# Patient Record
Sex: Female | Born: 1985 | Race: White | Hispanic: No | Marital: Married | Smoking: Never smoker
Health system: Southern US, Community
[De-identification: ages and names within clinical notes are randomized; demographics above are authoritative.]

## PROBLEM LIST (undated history)

## (undated) DIAGNOSIS — F329 Major depressive disorder, single episode, unspecified: Secondary | ICD-10-CM

## (undated) DIAGNOSIS — IMO0002 Reserved for concepts with insufficient information to code with codable children: Secondary | ICD-10-CM

## (undated) DIAGNOSIS — Z9889 Other specified postprocedural states: Secondary | ICD-10-CM

## (undated) DIAGNOSIS — R112 Nausea with vomiting, unspecified: Secondary | ICD-10-CM

## (undated) DIAGNOSIS — O99340 Other mental disorders complicating pregnancy, unspecified trimester: Secondary | ICD-10-CM

## (undated) DIAGNOSIS — F419 Anxiety disorder, unspecified: Secondary | ICD-10-CM

## (undated) DIAGNOSIS — C801 Malignant (primary) neoplasm, unspecified: Secondary | ICD-10-CM

## (undated) DIAGNOSIS — F32A Depression, unspecified: Secondary | ICD-10-CM

## (undated) DIAGNOSIS — T8859XA Other complications of anesthesia, initial encounter: Secondary | ICD-10-CM

## (undated) DIAGNOSIS — T4145XA Adverse effect of unspecified anesthetic, initial encounter: Secondary | ICD-10-CM

## (undated) HISTORY — DX: Malignant (primary) neoplasm, unspecified: C80.1

## (undated) HISTORY — DX: Anxiety disorder, unspecified: F41.9

## (undated) HISTORY — PX: APPENDECTOMY: SHX54

## (undated) HISTORY — DX: Reserved for concepts with insufficient information to code with codable children: IMO0002

---
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
## 2007-10-11 ENCOUNTER — Encounter: Admission: RE | Admit: 2007-10-11 | Discharge: 2007-10-11 | Payer: Self-pay | Admitting: Internal Medicine

## 2009-06-05 ENCOUNTER — Emergency Department: Payer: Self-pay | Admitting: Emergency Medicine

## 2009-06-06 ENCOUNTER — Emergency Department: Payer: Self-pay | Admitting: Emergency Medicine

## 2010-04-19 ENCOUNTER — Inpatient Hospital Stay: Payer: Self-pay | Admitting: Obstetrics and Gynecology

## 2011-01-16 ENCOUNTER — Ambulatory Visit: Payer: Self-pay | Admitting: Family Medicine

## 2012-08-18 ENCOUNTER — Ambulatory Visit: Payer: Self-pay

## 2013-09-10 ENCOUNTER — Encounter: Payer: Self-pay | Admitting: Maternal and Fetal Medicine

## 2013-09-20 ENCOUNTER — Encounter: Payer: Self-pay | Admitting: Maternal and Fetal Medicine

## 2013-10-26 DIAGNOSIS — F53 Postpartum depression: Secondary | ICD-10-CM

## 2013-10-26 DIAGNOSIS — O99345 Other mental disorders complicating the puerperium: Secondary | ICD-10-CM | POA: Insufficient documentation

## 2014-01-07 ENCOUNTER — Observation Stay: Payer: Self-pay | Admitting: Obstetrics and Gynecology

## 2014-01-07 LAB — LIPASE, BLOOD: Lipase: 96 U/L

## 2014-01-07 LAB — COMPREHENSIVE METABOLIC PANEL WITH GFR
Albumin: 2.6 g/dL — ABNORMAL LOW
Alkaline Phosphatase: 66 U/L
Anion Gap: 10
BUN: 6 mg/dL — ABNORMAL LOW
Bilirubin,Total: 0.6 mg/dL
Calcium, Total: 8.1 mg/dL — ABNORMAL LOW
Chloride: 105 mmol/L
Co2: 23 mmol/L
Creatinine: 0.47 mg/dL — ABNORMAL LOW
EGFR (African American): >60
EGFR (Non-African Amer.): >60
Glucose: 215 mg/dL — ABNORMAL HIGH
Osmolality: 280
Potassium: 3.1 mmol/L — ABNORMAL LOW
SGOT(AST): 13 U/L — ABNORMAL LOW
SGPT (ALT): 9 U/L — ABNORMAL LOW
Sodium: 138 mmol/L
Total Protein: 6 g/dL — ABNORMAL LOW

## 2014-01-07 LAB — URINALYSIS, COMPLETE
Bilirubin,UR: NEGATIVE
Blood: NEGATIVE
Glucose,UR: NEGATIVE mg/dL
Nitrite: NEGATIVE
Ph: 6
Protein: NEGATIVE
RBC,UR: 3 /HPF
Specific Gravity: 1.02
Squamous Epithelial: 3
WBC UR: 7 /HPF

## 2014-01-07 LAB — AMYLASE: Amylase: 45 U/L

## 2014-01-08 LAB — URINE CULTURE

## 2014-03-19 ENCOUNTER — Inpatient Hospital Stay: Payer: Self-pay

## 2014-03-19 LAB — CBC WITH DIFFERENTIAL/PLATELET
BASOS PCT: 0.7 %
Basophil #: 0.1 10*3/uL (ref 0.0–0.1)
Eosinophil #: 0.1 10*3/uL (ref 0.0–0.7)
Eosinophil %: 0.6 %
HCT: 40.7 % (ref 35.0–47.0)
HGB: 13.7 g/dL (ref 12.0–16.0)
Lymphocyte #: 1.8 10*3/uL (ref 1.0–3.6)
Lymphocyte %: 20.4 %
MCH: 33.8 pg (ref 26.0–34.0)
MCHC: 33.7 g/dL (ref 32.0–36.0)
MCV: 100 fL (ref 80–100)
MONO ABS: 0.5 x10 3/mm (ref 0.2–0.9)
Monocyte %: 6.2 %
Neutrophil #: 6.2 10*3/uL (ref 1.4–6.5)
Neutrophil %: 72.1 %
PLATELETS: 168 10*3/uL (ref 150–440)
RBC: 4.07 10*6/uL (ref 3.80–5.20)
RDW: 13.4 % (ref 11.5–14.5)
WBC: 8.6 10*3/uL (ref 3.6–11.0)

## 2014-03-20 LAB — HEMATOCRIT: HCT: 35.4 % (ref 35.0–47.0)

## 2014-12-28 NOTE — H&P (Signed)
L&D Evaluation:  History:  HPI 29 y/o G2P1001 EDC 03/15/14 here for postdates IOL. Well pregnancy, HX PPD (lexapro) GBS negative. Uc's through the night, denies leaking fluid or vaginal bleeding, baby is active.   Presents with contractions, IOL postdates   Patient's Medical History No Chronic Illness   Patient's Surgical History none   Medications Pre Natal Vitamins   Allergies NKDA   Social History none   Family History Non-Contributory   ROS:  ROS All systems were reviewed.  HEENT, CNS, GI, GU, Respiratory, CV, Renal and Musculoskeletal systems were found to be normal.   Exam:  Vital Signs stable   Urine Protein not completed   General no apparent distress   Mental Status clear   Chest clear   Heart normal sinus rhythm   Abdomen gravid, non-tender   Estimated Fetal Weight Average for gestational age   Fetal Position vtx   Fundal Height term   Back no CVAT   Edema 1+   Reflexes 2+   Clonus negative   Pelvic 2-3cm 50% vtx @ -1 at office prior   Mebranes Intact   FHT normal rate with no decels, baseline 130's 140's avg variabiity with accels 140's 150's   Fetal Heart Rate 136   Ucx irregular, Q 2/4 mins 45/60 sec mild/moderate   Skin dry   Lymph no lymphadenopathy   Impression:  Impression IOL postdates.   Plan:  Plan EFM/NST, monitor contractions and for cervical change   Comments L&D unit busy this am. NST reactive, UC's q 2/4 mins 45/60 sec mild/moderate. Will ambulate and move to labor room when available. Pt and husband state understanding.   Electronic Signatures: Rosie Fate (CNM)  (Signed 31-Jul-15 09:48)  Authored: L&D Evaluation   Last Updated: 31-Jul-15 09:48 by Rosie Fate (CNM)

## 2015-01-03 ENCOUNTER — Emergency Department
Admission: EM | Admit: 2015-01-03 | Discharge: 2015-01-03 | Disposition: A | Discharge status: Home / Self Care | Payer: BC Managed Care – PPO | Attending: Emergency Medicine | Admitting: Emergency Medicine

## 2015-01-03 ENCOUNTER — Emergency Department: Payer: BC Managed Care – PPO

## 2015-01-03 ENCOUNTER — Encounter: Payer: Self-pay | Admitting: Emergency Medicine

## 2015-01-03 DIAGNOSIS — Y998 Other external cause status: Secondary | ICD-10-CM | POA: Insufficient documentation

## 2015-01-03 DIAGNOSIS — S79912A Unspecified injury of left hip, initial encounter: Secondary | ICD-10-CM | POA: Diagnosis not present

## 2015-01-03 DIAGNOSIS — Z79899 Other long term (current) drug therapy: Secondary | ICD-10-CM | POA: Diagnosis not present

## 2015-01-03 DIAGNOSIS — M25552 Pain in left hip: Secondary | ICD-10-CM

## 2015-01-03 DIAGNOSIS — S161XXA Strain of muscle, fascia and tendon at neck level, initial encounter: Secondary | ICD-10-CM | POA: Diagnosis not present

## 2015-01-03 DIAGNOSIS — Y9389 Activity, other specified: Secondary | ICD-10-CM | POA: Diagnosis not present

## 2015-01-03 DIAGNOSIS — S299XXA Unspecified injury of thorax, initial encounter: Secondary | ICD-10-CM | POA: Insufficient documentation

## 2015-01-03 DIAGNOSIS — R52 Pain, unspecified: Secondary | ICD-10-CM

## 2015-01-03 DIAGNOSIS — S199XXA Unspecified injury of neck, initial encounter: Secondary | ICD-10-CM | POA: Diagnosis present

## 2015-01-03 DIAGNOSIS — Y92410 Unspecified street and highway as the place of occurrence of the external cause: Secondary | ICD-10-CM | POA: Insufficient documentation

## 2015-01-03 DIAGNOSIS — Z3202 Encounter for pregnancy test, result negative: Secondary | ICD-10-CM | POA: Insufficient documentation

## 2015-01-03 DIAGNOSIS — R0789 Other chest pain: Secondary | ICD-10-CM

## 2015-01-03 HISTORY — DX: Major depressive disorder, single episode, unspecified: F32.9

## 2015-01-03 HISTORY — DX: Depression, unspecified: F32.A

## 2015-01-03 HISTORY — DX: Other mental disorders complicating pregnancy, unspecified trimester: O99.340

## 2015-01-03 LAB — POCT PREGNANCY, URINE: Preg Test, Ur: NEGATIVE

## 2015-01-03 MED ORDER — DIAZEPAM 2 MG PO TABS: 2.0000 mg | ORAL_TABLET | Freq: Three times a day (TID) | ORAL | Status: DC | PRN

## 2015-01-03 MED ORDER — HYDROCODONE-ACETAMINOPHEN 5-325 MG PO TABS: 1.0000 | ORAL_TABLET | ORAL | Status: DC | PRN

## 2015-01-03 MED ORDER — HYDROCODONE-ACETAMINOPHEN 5-325 MG PO TABS
1.0000 | ORAL_TABLET | Freq: Once | ORAL | Status: AC
  Administered 2015-01-03: 1 via ORAL

## 2015-01-03 NOTE — Discharge Instructions (Signed)
Ice to muscles areas.  Take medication as directed.
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
## 2015-01-12 ENCOUNTER — Other Ambulatory Visit: Payer: Self-pay | Admitting: Family Medicine

## 2015-01-12 ENCOUNTER — Other Ambulatory Visit: Payer: BC Managed Care – PPO

## 2015-01-12 DIAGNOSIS — G43111 Migraine with aura, intractable, with status migrainosus: Secondary | ICD-10-CM

## 2015-01-12 DIAGNOSIS — F0781 Postconcussional syndrome: Secondary | ICD-10-CM

## 2015-01-14 ENCOUNTER — Ambulatory Visit
Admission: RE | Admit: 2015-01-14 | Discharge: 2015-01-14 | Disposition: A | Discharge status: Home / Self Care | Payer: BC Managed Care – PPO | Source: Ambulatory Visit | Attending: Family Medicine | Admitting: Family Medicine

## 2015-01-14 DIAGNOSIS — F0781 Postconcussional syndrome: Secondary | ICD-10-CM | POA: Diagnosis present

## 2015-01-14 DIAGNOSIS — G43111 Migraine with aura, intractable, with status migrainosus: Secondary | ICD-10-CM | POA: Insufficient documentation

## 2015-01-14 MED ORDER — GADOBENATE DIMEGLUMINE 529 MG/ML IV SOLN
15.0000 mL | Freq: Once | INTRAVENOUS | Status: AC | PRN
  Administered 2015-01-14: 12 mL via INTRAVENOUS

## 2015-01-18 ENCOUNTER — Other Ambulatory Visit: Payer: BC Managed Care – PPO

## 2015-01-27 DIAGNOSIS — F329 Major depressive disorder, single episode, unspecified: Secondary | ICD-10-CM | POA: Insufficient documentation

## 2015-01-27 DIAGNOSIS — F419 Anxiety disorder, unspecified: Secondary | ICD-10-CM | POA: Insufficient documentation

## 2015-01-27 DIAGNOSIS — S161XXA Strain of muscle, fascia and tendon at neck level, initial encounter: Secondary | ICD-10-CM | POA: Insufficient documentation

## 2015-01-27 DIAGNOSIS — Z8669 Personal history of other diseases of the nervous system and sense organs: Secondary | ICD-10-CM | POA: Insufficient documentation

## 2015-01-27 DIAGNOSIS — M7918 Myalgia, other site: Secondary | ICD-10-CM | POA: Insufficient documentation

## 2015-01-27 DIAGNOSIS — F0781 Postconcussional syndrome: Secondary | ICD-10-CM | POA: Insufficient documentation

## 2015-01-27 DIAGNOSIS — F32A Depression, unspecified: Secondary | ICD-10-CM | POA: Insufficient documentation

## 2015-02-14 ENCOUNTER — Encounter: Payer: Self-pay | Admitting: Physician Assistant

## 2015-02-14 ENCOUNTER — Ambulatory Visit (INDEPENDENT_AMBULATORY_CARE_PROVIDER_SITE_OTHER): Payer: BC Managed Care – PPO | Admitting: Physician Assistant

## 2015-02-14 ENCOUNTER — Other Ambulatory Visit: Payer: Self-pay

## 2015-02-14 VITALS — BP 108/62 | HR 80 | Temp 97.8°F | Resp 16 | Wt 133.4 lb

## 2015-02-14 DIAGNOSIS — M542 Cervicalgia: Secondary | ICD-10-CM | POA: Insufficient documentation

## 2015-02-14 DIAGNOSIS — G43119 Migraine with aura, intractable, without status migrainosus: Secondary | ICD-10-CM

## 2015-02-14 DIAGNOSIS — G43909 Migraine, unspecified, not intractable, without status migrainosus: Secondary | ICD-10-CM | POA: Insufficient documentation

## 2015-02-14 DIAGNOSIS — M5412 Radiculopathy, cervical region: Secondary | ICD-10-CM
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
## 2015-02-14 NOTE — Patient Instructions (Signed)

## 2015-03-06 DIAGNOSIS — G43119 Migraine with aura, intractable, without status migrainosus: Secondary | ICD-10-CM | POA: Insufficient documentation

## 2015-03-14 ENCOUNTER — Ambulatory Visit (INDEPENDENT_AMBULATORY_CARE_PROVIDER_SITE_OTHER): Payer: BC Managed Care – PPO | Admitting: Physician Assistant

## 2015-03-14 ENCOUNTER — Encounter: Payer: Self-pay | Admitting: Physician Assistant

## 2015-03-14 VITALS — BP 102/60 | HR 80 | Temp 97.4°F | Resp 16 | Ht 63.0 in | Wt 136.0 lb

## 2015-03-14 DIAGNOSIS — F32A Depression, unspecified: Secondary | ICD-10-CM

## 2015-03-14 DIAGNOSIS — G43109 Migraine with aura, not intractable, without status migrainosus: Secondary | ICD-10-CM

## 2015-03-14 DIAGNOSIS — E538 Deficiency of other specified B group vitamins: Secondary | ICD-10-CM

## 2015-03-14 DIAGNOSIS — F419 Anxiety disorder, unspecified: Secondary | ICD-10-CM | POA: Diagnosis not present

## 2015-03-14 DIAGNOSIS — F329 Major depressive disorder, single episode, unspecified: Secondary | ICD-10-CM

## 2015-03-14 DIAGNOSIS — M542 Cervicalgia: Secondary | ICD-10-CM | POA: Diagnosis not present

## 2015-03-14 MED ORDER — ALPRAZOLAM 0.25 MG PO TABS: 0.2500 mg | ORAL_TABLET | Freq: Every day | ORAL | Status: DC | PRN

## 2015-03-14 MED ORDER — SUMATRIPTAN SUCCINATE 25 MG PO TABS: 25.0000 mg | ORAL_TABLET | Freq: Once | ORAL | Status: DC

## 2015-03-14 MED ORDER — ESCITALOPRAM OXALATE 20 MG PO TABS: 20.0000 mg | ORAL_TABLET | Freq: Every day | ORAL | Status: DC
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
## 2015-03-14 NOTE — Patient Instructions (Signed)

## 2015-03-15 DIAGNOSIS — E538 Deficiency of other specified B group vitamins: Secondary | ICD-10-CM | POA: Insufficient documentation

## 2015-03-16 ENCOUNTER — Telehealth: Payer: Self-pay | Admitting: Physician Assistant

## 2015-03-16 DIAGNOSIS — M542 Cervicalgia: Secondary | ICD-10-CM

## 2015-03-16 NOTE — Telephone Encounter (Signed)
Pt is requesting referral to continue going to [facility] on [address]

## 2015-03-16 NOTE — Telephone Encounter (Signed)
Order placed to continue PT at [facility].  Thanks! -JB

## 2015-05-24 ENCOUNTER — Telehealth: Payer: Self-pay | Admitting: Physician Assistant

## 2015-05-24 NOTE — Telephone Encounter (Signed)
Pt is requesting a call back to discuss results from physical therapy.  IR#[phone]

## 2015-05-25 NOTE — Telephone Encounter (Signed)
She states she is doing much better. She does still have occasional days with some limited motion and pain but these are very infrequent. She states that she now only gets the pain once a week to maybe once every 2 weeks. She has been discharged from physical therapy at this time due to her improvement. I did advise her to continue the home exercises that she was given at physical therapy coupled with the continued chiropractic therapy once a week will hopefully give her complete healing. If she does have any increase in frequency of the pain and limited motions she is to give [place] a call here at the office and we may restart formal physical therapy. She does have a follow-up with me in approximately 3 weeks.

## 2015-06-15 ENCOUNTER — Ambulatory Visit (INDEPENDENT_AMBULATORY_CARE_PROVIDER_SITE_OTHER): Payer: BC Managed Care – PPO | Admitting: Physician Assistant

## 2015-06-15 ENCOUNTER — Encounter: Payer: Self-pay | Admitting: Physician Assistant

## 2015-06-15 VITALS — BP 90/54 | HR 68 | Temp 98.5°F | Resp 14 | Ht 63.0 in | Wt 137.4 lb

## 2015-06-15 DIAGNOSIS — F329 Major depressive disorder, single episode, unspecified: Secondary | ICD-10-CM | POA: Diagnosis not present

## 2015-06-15 DIAGNOSIS — M542 Cervicalgia: Secondary | ICD-10-CM

## 2015-06-15 DIAGNOSIS — Z23 Encounter for immunization: Secondary | ICD-10-CM | POA: Diagnosis not present

## 2015-06-15 DIAGNOSIS — F32A Depression, unspecified: Secondary | ICD-10-CM

## 2015-06-15 NOTE — Patient Instructions (Signed)
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
## 2015-07-22 ENCOUNTER — Ambulatory Visit (INDEPENDENT_AMBULATORY_CARE_PROVIDER_SITE_OTHER): Payer: BC Managed Care – PPO | Admitting: Family Medicine

## 2015-07-22 ENCOUNTER — Encounter: Payer: Self-pay | Admitting: Family Medicine

## 2015-07-22 VITALS — BP 98/62 | HR 67 | Temp 98.2°F | Resp 14 | Wt 138.2 lb

## 2015-07-22 DIAGNOSIS — B349 Viral infection, unspecified: Secondary | ICD-10-CM

## 2015-07-22 MED ORDER — HYDROCODONE-HOMATROPINE 5-1.5 MG/5ML PO SYRP: ORAL_SOLUTION | ORAL | Status: DC

## 2015-07-22 NOTE — Patient Instructions (Signed)
Discussed use of Mucinex D for congestion and Delsym for cough. 

## 2015-07-22 NOTE — Progress Notes (Signed)
Subjective:     Patient ID: Valerie West, female   DOB: December 03, 1985, 29 y.o.   MRN: [id number]  HPI  Chief Complaint  Patient presents with  . Sore Throat    Patient comes in office today with concerns of cough/congestion and sore throat for the past 3 days. Patient states that prior to symptoms starting she was experiencing G.I upset 5 days ago that last 2 days. Patient reports she has sinus pressure below eyes, she has taken otc Alka-Seltzer cold to relieve symptoms.   Reports transient fever of 101.3 at onset. 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   Review of Systems     Objective:   Physical Exam  Constitutional: She appears well-developed and well-nourished. No distress.  Ears: T.M's intact without inflammation Sinuses: non-tender Throat: no tonsillar enlargement or exudate Neck: no cervical adenopathy Lungs: clear     Assessment:    1. Viral syndrome - HYDROcodone-homatropine (HYCODAN) 5-1.5 MG/5ML syrup; 5 ml 4-6 hours as needed for cough  Dispense: 240 mL; Refill: 0    Plan:    Discussed use of Delsym and Mucinex D

## 2015-09-19 ENCOUNTER — Other Ambulatory Visit: Payer: Self-pay | Admitting: Physician Assistant

## 2015-09-20 NOTE — Telephone Encounter (Signed)
Rx for Alp[facility] 0.25 [facility] [address]. Spoke with Richardson Landry (pharmacist).   LM for Patient that Prescription has been called in.  Thanks,  -Joseline

## 2015-09-27 ENCOUNTER — Other Ambulatory Visit: Payer: Self-pay | Admitting: Physician Assistant

## 2015-09-27 DIAGNOSIS — F329 Major depressive disorder, single episode, unspecified: Secondary | ICD-10-CM

## 2015-09-27 DIAGNOSIS — F32A Depression, unspecified: Secondary | ICD-10-CM

## 2015-11-25 ENCOUNTER — Ambulatory Visit (INDEPENDENT_AMBULATORY_CARE_PROVIDER_SITE_OTHER): Payer: BC Managed Care – PPO | Admitting: Physician Assistant

## 2015-11-25 ENCOUNTER — Encounter: Payer: Self-pay | Admitting: Physician Assistant

## 2015-11-25 VITALS — BP 110/60 | HR 64 | Temp 98.1°F | Resp 16 | Wt 138.6 lb

## 2015-11-25 DIAGNOSIS — J029 Acute pharyngitis, unspecified: Secondary | ICD-10-CM | POA: Diagnosis not present

## 2015-11-25 DIAGNOSIS — J0100 Acute maxillary sinusitis, unspecified: Secondary | ICD-10-CM

## 2015-11-25 LAB — POCT RAPID STREP A (OFFICE): RAPID STREP A SCREEN: NEGATIVE

## 2015-11-25 MED ORDER — AMOXICILLIN 875 MG PO TABS: 875.0000 mg | ORAL_TABLET | Freq: Two times a day (BID) | ORAL | Status: DC

## 2015-11-25 NOTE — Progress Notes (Signed)
Patient: Valerie West Female    DOB: 03-Jan-1986   30 y.o.   MRN: [id number] Visit Date: 11/25/2015  Today's Provider: Mar Daring, PA-C   Chief Complaint  Patient presents with  . Sore Throat   Subjective:    Sore Throat  This is a new problem. The current episode started in the past 7 days (Tuesday). There has been no fever. Associated symptoms include ear pain, headaches, neck pain, swollen glands and trouble swallowing. Pertinent negatives include no abdominal pain, congestion, coughing, ear discharge, hoarse voice, shortness of breath or vomiting. 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 Treatments tried: Advil.  There has been a lot of strep and flu at her school. Her classroom has had increased strep. She states her throat is hurting enough it is hard for her to eat or drink as well.    No Known Allergies Previous Medications   ALPRAZOLAM (XANAX) 0.25 MG TABLET    TAKE 1 TABLET BY MOUTH AS NEEDED FOR ANXIETY   ESCITALOPRAM (LEXAPRO) 20 MG TABLET    TAKE 1 TABLET BY MOUTH EVERY DAY   HYDROCODONE-HOMATROPINE (HYCODAN) 5-1.5 MG/5ML SYRUP    5 ml 4-6 hours as needed for cough   MELATONIN-PYRIDOXINE ([facility] MELATONIN) 5-10 MG TBCR    Take by mouth.   SUMATRIPTAN (IMITREX) 25 MG TABLET    Take 1 tablet (25 mg total) by mouth once. AT ONSET OF MIGRAINE HEADACHE   TRI-PREVIFEM 0.18/0.215/0.25 MG-35 MCG TABLET    Take 1 tablet by mouth daily.   VITAMIN B-12 (CYANOCOBALAMIN) 1000 MCG TABLET    Take 1,000 mcg by mouth daily.    Review of Systems  Constitutional: Positive for chills. Negative for fever (but taking IBU regularly for throat pain) and fatigue.  HENT: Positive for ear pain, sore throat and trouble swallowing. Negative for congestion, ear discharge, hoarse voice, postnasal drip, rhinorrhea, sinus pressure and sneezing.   Eyes: Negative.   Respiratory: Negative for cough, chest tightness, shortness of breath and wheezing.   Cardiovascular: Negative  for chest pain, palpitations and leg swelling.  Gastrointestinal: Negative for nausea, vomiting and abdominal pain.  Musculoskeletal: Positive for neck pain.  Neurological: Positive for headaches. Negative for dizziness.    Social History  Substance Use Topics  . Smoking status: Never Smoker   . Smokeless tobacco: Never Used  . Alcohol Use: 0.0 oz/week    0 Standard drinks or equivalent per week     Comment: occassional   Objective:   BP 110/60 mmHg  Pulse 64  Temp(Src) 98.1 F (36.7 C) (Oral)  Resp 16  Wt 138 lb 9.6 oz (62.869 kg)  LMP 11/15/2014  Physical Exam  Constitutional: She appears well-developed and well-nourished. No distress.  HENT:  Head: Normocephalic and atraumatic.  Right Ear: Hearing, tympanic membrane, external ear and ear canal normal. Tympanic membrane is not erythematous and not bulging. No middle ear effusion.  Left Ear: Hearing, external ear and ear canal normal. Tympanic membrane is not erythematous and not bulging. A middle ear effusion is present.  Nose: Mucosal edema and rhinorrhea present. Right sinus exhibits maxillary sinus tenderness. Right sinus exhibits no frontal sinus tenderness. Left sinus exhibits maxillary sinus tenderness. Left sinus exhibits no frontal sinus tenderness.  Mouth/Throat: Uvula is midline and mucous membranes are normal. Posterior oropharyngeal edema and posterior oropharyngeal erythema present. No oropharyngeal exudate.  Eyes: Conjunctivae are normal. Pupils are equal, round, and reactive to light. Right eye exhibits  no discharge. Left eye exhibits no discharge. No scleral icterus.  Neck: Normal range of motion. Neck supple. No tracheal deviation present. No thyromegaly present.  Cardiovascular: Normal rate, regular rhythm and normal heart sounds.  Exam reveals no gallop and no friction rub.   No murmur heard. Pulmonary/Chest: Effort normal and breath sounds normal. No stridor. No respiratory distress. She has no wheezes. She  has no rales.  Lymphadenopathy:    She has no cervical adenopathy.  Skin: Skin is warm and dry. She is not diaphoretic.  Vitals reviewed.       Assessment & Plan:     1. Acute maxillary sinusitis, recurrence not specified Worsening symptoms not responding to treatment. Will give amoxicillin as below. She may continue IBU for pain and fevers. Discussed using salt water gargles and chloraseptic spray. Stay well hydrated and get plenty of rest. Call if symptoms fail to improve or worsen. - amoxicillin (AMOXIL) 875 MG tablet; Take 1 tablet (875 mg total) by mouth 2 (two) times daily.  Dispense: 20 tablet; Refill: 0  2. Pharyngitis See above medical treatment plan. - amoxicillin (AMOXIL) 875 MG tablet; Take 1 tablet (875 mg total) by mouth 2 (two) times daily.  Dispense: 20 tablet; Refill: 0  3. Sore throat Rapid strep negative. - POCT rapid strep A - amoxicillin (AMOXIL) 875 MG tablet; Take 1 tablet (875 mg total) by mouth 2 (two) times daily.  Dispense: 20 tablet; Refill: 0       Mar Daring, PA-C  [facility]

## 2015-11-25 NOTE — Patient Instructions (Signed)
Sinusitis, Adult Sinusitis is redness, soreness, and inflammation of the paranasal sinuses. Paranasal sinuses are air pockets within the bones of your face. They are located beneath your eyes, in the middle of your forehead, and above your eyes. In healthy paranasal sinuses, mucus is able to drain out, and air is able to circulate through them by way of your nose. However, when your paranasal sinuses are inflamed, mucus and air can become trapped. This can allow bacteria and other germs to grow and cause infection. Sinusitis can develop quickly and last only a short time (acute) or continue over a long period (chronic). Sinusitis that lasts for more than 12 weeks is considered chronic. CAUSES Causes of sinusitis include:  Allergies.  Structural abnormalities, such as displacement of the cartilage that separates your nostrils (deviated septum), which can decrease the air flow through your nose and sinuses and affect sinus drainage.  Functional abnormalities, such as when the small hairs (cilia) that line your sinuses and help remove mucus do not work properly or are not present. SIGNS AND SYMPTOMS Symptoms of acute and chronic sinusitis are the same. The primary symptoms are pain and pressure around the affected sinuses. Other symptoms include:  Upper toothache.  Earache.  Headache.  Bad breath.  Decreased sense of smell and taste.  A cough, which worsens when you are lying flat.  Fatigue.  Fever.  Thick drainage from your nose, which often is green and may contain pus (purulent).  Swelling and warmth over the affected sinuses. DIAGNOSIS Your health care provider will perform a physical exam. During your exam, your health care provider may perform any of the following to help determine if you have acute sinusitis or chronic sinusitis:  Look in your nose for signs of abnormal growths in your nostrils (nasal polyps).  Tap over the affected sinus to check for signs of  infection.  View the inside of your sinuses using an imaging device that has a light attached (endoscope). If your health care provider suspects that you have chronic sinusitis, one or more of the following tests may be recommended:  Allergy tests.  Nasal culture. A sample of mucus is taken from your nose, sent to a lab, and screened for bacteria.  Nasal cytology. A sample of mucus is taken from your nose and examined by your health care provider to determine if your sinusitis is related to an allergy. TREATMENT Most cases of acute sinusitis are related to a viral infection and will resolve on their own within 10 days. Sometimes, medicines are prescribed to help relieve symptoms of both acute and chronic sinusitis. These may include pain medicines, decongestants, nasal steroid sprays, or saline sprays. However, for sinusitis related to a bacterial infection, your health care provider will prescribe antibiotic medicines. These are medicines that will help kill the bacteria causing the infection. Rarely, sinusitis is caused by a fungal infection. In these cases, your health care provider will prescribe antifungal medicine. For some cases of chronic sinusitis, surgery is needed. Generally, these are cases in which sinusitis recurs more than 3 times per year, despite other treatments. HOME CARE INSTRUCTIONS  Drink plenty of water. Water helps thin the mucus so your sinuses can drain more easily.  Use a humidifier.  Inhale steam 3-4 times a day (for example, sit in the bathroom with the shower running).  Apply a warm, moist washcloth to your face 3-4 times a day, or as directed by your health care provider.  Use saline nasal sprays to help   moisten and clean your sinuses. °· Take medicines only as directed by your health care provider. °· If you were prescribed either an antibiotic or antifungal medicine, finish it all even if you start to feel better. °SEEK IMMEDIATE MEDICAL CARE IF: °· You have  increasing pain or severe headaches. °· You have nausea, vomiting, or drowsiness. °· You have swelling around your face. °· You have vision problems. °· You have a stiff neck. °· You have difficulty breathing. °  °This information is not intended to replace advice given to you by your health care provider. Make sure you discuss any questions you have with your health care provider. °  °Document Released: 08/06/2005 Document Revised: 08/27/2014 Document Reviewed: 08/21/2011 °Elsevier Interactive Patient Education ©2016 [employer] ° °Pharyngitis °Pharyngitis is redness, pain, and swelling (inflammation) of your pharynx.  °CAUSES  °Pharyngitis is usually caused by infection. Most of the time, these infections are from viruses (viral) and are part of a cold. However, sometimes pharyngitis is caused by bacteria (bacterial). Pharyngitis can also be caused by allergies. Viral pharyngitis may be spread from person to person by coughing, sneezing, and personal items or utensils (cups, forks, spoons, toothbrushes). Bacterial pharyngitis may be spread from person to person by more intimate contact, such as kissing.  °SIGNS AND SYMPTOMS  °Symptoms of pharyngitis include:   °· Sore throat.   °· Tiredness (fatigue).   °· Low-grade fever.   °· Headache. °· Joint pain and muscle aches. °· Skin rashes. °· Swollen lymph nodes. °· Plaque-like film on throat or tonsils (often seen with bacterial pharyngitis). °DIAGNOSIS  °Your health care provider will ask you questions about your illness and your symptoms. Your medical history, along with a physical exam, is often all that is needed to diagnose pharyngitis. Sometimes, a rapid strep test is done. Other lab tests may also be done, depending on the suspected cause.  °TREATMENT  °Viral pharyngitis will usually get better in 3-4 days without the use of medicine. Bacterial pharyngitis is treated with medicines that kill germs (antibiotics).  °HOME CARE INSTRUCTIONS  °· Drink enough water  and fluids to keep your urine clear or pale yellow.   °· Only take over-the-counter or prescription medicines as directed by your health care provider:   °¨ If you are prescribed antibiotics, make sure you finish them even if you start to feel better.   °¨ Do not take aspirin.   °· Get lots of rest.   °· Gargle with 8 oz of salt water (½ tsp of salt per 1 qt of water) as often as every 1-2 hours to soothe your throat.   °· Throat lozenges (if you are not at risk for choking) or sprays may be used to soothe your throat. °SEEK MEDICAL CARE IF:  °· You have large, tender lumps in your neck. °· You have a rash. °· You cough up green, yellow-brown, or bloody spit. °SEEK IMMEDIATE MEDICAL CARE IF:  °· Your neck becomes stiff. °· You drool or are unable to swallow liquids. °· You vomit or are unable to keep medicines or liquids down. °· You have severe pain that does not go away with the use of recommended medicines. °· You have trouble breathing (not caused by a stuffy nose). °MAKE SURE YOU:  °· Understand these instructions. °· Will watch your condition. °· Will get help right away if you are not doing well or get worse. °  °This information is not intended to replace advice given to you by your health care provider. Make sure you discuss any   questions you have with your health care provider. °  °Document Released: 08/06/2005 Document Revised: 05/27/2013 Document Reviewed: 04/13/2013 °Elsevier Interactive Patient Education ©2016 [employer] Inc. ° °

## 2015-12-01 ENCOUNTER — Telehealth: Payer: Self-pay | Admitting: Physician Assistant

## 2015-12-01 DIAGNOSIS — T3695XA Adverse effect of unspecified systemic antibiotic, initial encounter: Principal | ICD-10-CM

## 2015-12-01 DIAGNOSIS — B379 Candidiasis, unspecified: Secondary | ICD-10-CM

## 2015-12-01 MED ORDER — FLUCONAZOLE 150 MG PO TABS: 150.0000 mg | ORAL_TABLET | Freq: Once | ORAL | Status: DC

## 2015-12-01 NOTE — Telephone Encounter (Signed)
[facility] 

## 2015-12-01 NOTE — Telephone Encounter (Signed)
Please advise.  Thanks,  -Joseline 

## 2015-12-01 NOTE — Telephone Encounter (Signed)
Pt states she started an antibiotic on Friday and is now having itching and burning in her vaginal area.  Pt is also having lower back pain.  Pt is requesting a Rx to help with this.  [facility].  [id number]

## 2016-04-07 ENCOUNTER — Other Ambulatory Visit: Payer: Self-pay | Admitting: Physician Assistant

## 2016-06-13 ENCOUNTER — Encounter: Payer: BC Managed Care – PPO | Admitting: Physician Assistant

## 2016-06-14 ENCOUNTER — Encounter: Payer: Self-pay | Admitting: Physician Assistant

## 2016-06-14 ENCOUNTER — Ambulatory Visit (INDEPENDENT_AMBULATORY_CARE_PROVIDER_SITE_OTHER): Payer: BC Managed Care – PPO | Admitting: Physician Assistant

## 2016-06-14 VITALS — BP 108/70 | HR 86 | Temp 97.7°F | Resp 16 | Wt 142.2 lb

## 2016-06-14 DIAGNOSIS — F419 Anxiety disorder, unspecified: Secondary | ICD-10-CM | POA: Diagnosis not present

## 2016-06-14 DIAGNOSIS — Z0000 Encounter for general adult medical examination without abnormal findings: Secondary | ICD-10-CM

## 2016-06-14 NOTE — Patient Instructions (Signed)
Health Maintenance, Female Adopting a healthy lifestyle and getting preventive care can go a long way to promote health and wellness. Talk with your health care provider about what schedule of regular examinations is right for you. This is a good chance for you to check in with your provider about disease prevention and staying healthy. In between checkups, there are plenty of things you can do on your own. Experts have done a lot of research about which lifestyle changes and preventive measures are most likely to keep you healthy. Ask your health care provider for more information. WEIGHT AND DIET  Eat a healthy diet  Be sure to include plenty of vegetables, fruits, low-fat dairy products, and lean protein.  Do not eat a lot of foods high in solid fats, added sugars, or salt.  Get regular exercise. This is one of the most important things you can do for your health.  Most adults should exercise for at least 150 minutes each week. The exercise should increase your heart rate and make you sweat (moderate-intensity exercise).  Most adults should also do strengthening exercises at least twice a week. This is in addition to the moderate-intensity exercise.  Maintain a healthy weight  Body mass index (BMI) is a measurement that can be used to identify possible weight problems. It estimates body fat based on height and weight. Your health care provider can help determine your BMI and help you achieve or maintain a healthy weight.  For females 20 years of age and older:   A BMI below 18.5 is considered underweight.  A BMI of 18.5 to 24.9 is normal.  A BMI of 25 to 29.9 is considered overweight.  A BMI of 30 and above is considered obese.  Watch levels of cholesterol and blood lipids  You should start having your blood tested for lipids and cholesterol at 32 years of age, then have this test every 5 years.  You may need to have your cholesterol levels checked more often if:  Your lipid  or cholesterol levels are high.  You are older than 32 years of age.  You are at high risk for heart disease.  CANCER SCREENING   Lung Cancer  Lung cancer screening is recommended for adults 55-80 years old who are at high risk for lung cancer because of a history of smoking.  A yearly low-dose CT scan of the lungs is recommended for people who:  Currently smoke.  Have quit within the past 15 years.  Have at least a 30-pack-year history of smoking. A pack year is smoking an average of one pack of cigarettes a day for 1 year.  Yearly screening should continue until it has been 15 years since you quit.  Yearly screening should stop if you develop a health problem that would prevent you from having lung cancer treatment.  Breast Cancer  Practice breast self-awareness. This means understanding how your breasts normally appear and feel.  It also means doing regular breast self-exams. Let your health care provider know about any changes, no matter how small.  If you are in your 20s or 30s, you should have a clinical breast exam (CBE) by a health care provider every 1-3 years as part of a regular health exam.  If you are 40 or older, have a CBE every year. Also consider having a breast X-ray (mammogram) every year.  If you have a family history of breast cancer, talk to your health care provider about genetic screening.  If you   are at high risk for breast cancer, talk to your health care provider about having an MRI and a mammogram every year.  Breast cancer gene (BRCA) assessment is recommended for women who have family members with BRCA-related cancers. BRCA-related cancers include:  Breast.  Ovarian.  Tubal.  Peritoneal cancers.  Results of the assessment will determine the need for genetic counseling and BRCA1 and BRCA2 testing. Cervical Cancer Your health care provider may recommend that you be screened regularly for cancer of the pelvic organs (ovaries, uterus, and  vagina). This screening involves a pelvic examination, including checking for microscopic changes to the surface of your cervix (Pap test). You may be encouraged to have this screening done every 3 years, beginning at age 21.  For women ages 30-65, health care providers may recommend pelvic exams and Pap testing every 3 years, or they may recommend the Pap and pelvic exam, combined with testing for human papilloma virus (HPV), every 5 years. Some types of HPV increase your risk of cervical cancer. Testing for HPV may also be done on women of any age with unclear Pap test results.  Other health care providers may not recommend any screening for nonpregnant women who are considered low risk for pelvic cancer and who do not have symptoms. Ask your health care provider if a screening pelvic exam is right for you.  If you have had past treatment for cervical cancer or a condition that could lead to cancer, you need Pap tests and screening for cancer for at least 20 years after your treatment. If Pap tests have been discontinued, your risk factors (such as having a new sexual partner) need to be reassessed to determine if screening should resume. Some women have medical problems that increase the chance of getting cervical cancer. In these cases, your health care provider may recommend more frequent screening and Pap tests. Colorectal Cancer  This type of cancer can be detected and often prevented.  Routine colorectal cancer screening usually begins at 32 years of age and continues through 32 years of age.  Your health care provider may recommend screening at an earlier age if you have risk factors for colon cancer.  Your health care provider may also recommend using home test kits to check for hidden blood in the stool.  A small camera at the end of a tube can be used to examine your colon directly (sigmoidoscopy or colonoscopy). This is done to check for the earliest forms of colorectal  cancer.  Routine screening usually begins at age 50.  Direct examination of the colon should be repeated every 5-10 years through 32 years of age. However, you may need to be screened more often if early forms of precancerous polyps or small growths are found. Skin Cancer  Check your skin from head to toe regularly.  Tell your health care provider about any new moles or changes in moles, especially if there is a change in a mole's shape or color.  Also tell your health care provider if you have a mole that is larger than the size of a pencil eraser.  Always use sunscreen. Apply sunscreen liberally and repeatedly throughout the day.  Protect yourself by wearing long sleeves, pants, a wide-brimmed hat, and sunglasses whenever you are outside. HEART DISEASE, DIABETES, AND HIGH BLOOD PRESSURE   High blood pressure causes heart disease and increases the risk of stroke. High blood pressure is more likely to develop in:  People who have blood pressure in the high end   of the normal range (130-139/85-89 mm Hg).  People who are overweight or obese.  People who are African American.  If you are 38-23 years of age, have your blood pressure checked every 3-5 years. If you are 61 years of age or older, have your blood pressure checked every year. You should have your blood pressure measured twice--once when you are at a hospital or clinic, and once when you are not at a hospital or clinic. Record the average of the two measurements. To check your blood pressure when you are not at a hospital or clinic, you can use:  An automated blood pressure machine at a pharmacy.  A home blood pressure monitor.  If you are between 45 years and 39 years old, ask your health care provider if you should take aspirin to prevent strokes.  Have regular diabetes screenings. This involves taking a blood sample to check your fasting blood sugar level.  If you are at a normal weight and have a low risk for diabetes,  have this test once every three years after 32 years of age.  If you are overweight and have a high risk for diabetes, consider being tested at a younger age or more often. PREVENTING INFECTION  Hepatitis B  If you have a higher risk for hepatitis B, you should be screened for this virus. You are considered at high risk for hepatitis B if:  You were born in a country where hepatitis B is common. Ask your health care provider which countries are considered high risk.  Your parents were born in a high-risk country, and you have not been immunized against hepatitis B (hepatitis B vaccine).  You have HIV or AIDS.  You use needles to inject street drugs.  You live with someone who has hepatitis B.  You have had sex with someone who has hepatitis B.  You get hemodialysis treatment.  You take certain medicines for conditions, including cancer, organ transplantation, and autoimmune conditions. Hepatitis C  Blood testing is recommended for:  Everyone born from [birth year] through 1967.  Anyone with known risk factors for hepatitis C. Sexually transmitted infections (STIs)  You should be screened for sexually transmitted infections (STIs) including gonorrhea and chlamydia if:  You are sexually active and are younger than 32 years of age.  You are older than 32 years of age and your health care provider tells you that you are at risk for this type of infection.  Your sexual activity has changed since you were last screened and you are at an increased risk for chlamydia or gonorrhea. Ask your health care provider if you are at risk.  If you do not have HIV, but are at risk, it may be recommended that you take a prescription medicine daily to prevent HIV infection. This is called pre-exposure prophylaxis (PrEP). You are considered at risk if:  You are sexually active and do not regularly use condoms or know the HIV status of your partner(s).  You take drugs by injection.  You are sexually  active with a partner who has HIV. Talk with your health care provider about whether you are at high risk of being infected with HIV. If you choose to begin PrEP, you should first be tested for HIV. You should then be tested every 3 months for as long as you are taking PrEP.  PREGNANCY   If you are premenopausal and you may become pregnant, ask your health care provider about preconception counseling.  If you may  become pregnant, take 400 to 800 micrograms (mcg) of folic acid every day.  If you want to prevent pregnancy, talk to your health care provider about birth control (contraception). OSTEOPOROSIS AND MENOPAUSE   Osteoporosis is a disease in which the bones lose minerals and strength with aging. This can result in serious bone fractures. Your risk for osteoporosis can be identified using a bone density scan.  If you are 61 years of age or older, or if you are at risk for osteoporosis and fractures, ask your health care provider if you should be screened.  Ask your health care provider whether you should take a calcium or vitamin D supplement to lower your risk for osteoporosis.  Menopause may have certain physical symptoms and risks.  Hormone replacement therapy may reduce some of these symptoms and risks. Talk to your health care provider about whether hormone replacement therapy is right for you.  HOME CARE INSTRUCTIONS   Schedule regular health, dental, and eye exams.  Stay current with your immunizations.   Do not use any tobacco products including cigarettes, chewing tobacco, or electronic cigarettes.  If you are pregnant, do not drink alcohol.  If you are breastfeeding, limit how much and how often you drink alcohol.  Limit alcohol intake to no more than 1 drink per day for nonpregnant women. One drink equals 12 ounces of beer, 5 ounces of wine, or 1 ounces of hard liquor.  Do not use street drugs.  Do not share needles.  Ask your health care provider for help if  you need support or information about quitting drugs.  Tell your health care provider if you often feel depressed.  Tell your health care provider if you have ever been abused or do not feel safe at home.   This information is not intended to replace advice given to you by your health care provider. Make sure you discuss any questions you have with your health care provider.   Document Released: 02/19/2011 Document Revised: 08/27/2014 Document Reviewed: 07/08/2013 Elsevier Interactive Patient Education [employer].
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
## 2016-06-16 LAB — CBC WITH DIFFERENTIAL/PLATELET
BASOS ABS: 0 10*3/uL (ref 0.0–0.2)
Basos: 1 %
EOS (ABSOLUTE): 0.1 10*3/uL (ref 0.0–0.4)
Eos: 3 %
Hematocrit: 40 % (ref 34.0–46.6)
Hemoglobin: 13.8 g/dL (ref 11.1–15.9)
IMMATURE GRANULOCYTES: 0 %
Immature Grans (Abs): 0 10*3/uL (ref 0.0–0.1)
LYMPHS ABS: 1.6 10*3/uL (ref 0.7–3.1)
Lymphs: 37 %
MCH: 32.2 pg (ref 26.6–33.0)
MCHC: 34.5 g/dL (ref 31.5–35.7)
MCV: 93 fL (ref 79–97)
MONOS ABS: 0.3 10*3/uL (ref 0.1–0.9)
Monocytes: 8 %
Neutrophils Absolute: 2.2 10*3/uL (ref 1.4–7.0)
Neutrophils: 51 %
PLATELETS: 242 10*3/uL (ref 150–379)
RBC: 4.29 x10E6/uL (ref 3.77–5.28)
RDW: 12.6 % (ref 12.3–15.4)
WBC: 4.3 10*3/uL (ref 3.4–10.8)

## 2016-06-16 LAB — TSH: TSH: 2.31 u[IU]/mL (ref 0.450–4.500)

## 2016-06-16 LAB — LIPID PANEL
CHOLESTEROL TOTAL: 120 mg/dL (ref 100–199)
Chol/HDL Ratio: 1.8 ratio units (ref 0.0–4.4)
HDL: 66 mg/dL (ref >39)
LDL CALC: 45 mg/dL (ref 0–99)
TRIGLYCERIDES: 44 mg/dL (ref 0–149)
VLDL Cholesterol Cal: 9 mg/dL (ref 5–40)

## 2016-06-16 LAB — COMPREHENSIVE METABOLIC PANEL
ALK PHOS: 67 IU/L (ref 39–117)
ALT: 19 IU/L (ref 0–32)
AST: 16 IU/L (ref 0–40)
Albumin/Globulin Ratio: 2 (ref 1.2–2.2)
Albumin: 4.3 g/dL (ref 3.5–5.5)
BUN/Creatinine Ratio: 16 (ref 9–23)
BUN: 11 mg/dL (ref 6–20)
Bilirubin Total: 0.3 mg/dL (ref 0.0–1.2)
CO2: 23 mmol/L (ref 18–29)
Calcium: 9 mg/dL (ref 8.7–10.2)
Chloride: 103 mmol/L (ref 96–106)
Creatinine, Ser: 0.68 mg/dL (ref 0.57–1.00)
GFR calc Af Amer: 134 mL/min/{1.73_m2} (ref >59)
GFR calc non Af Amer: 116 mL/min/{1.73_m2} (ref >59)
GLOBULIN, TOTAL: 2.2 g/dL (ref 1.5–4.5)
GLUCOSE: 92 mg/dL (ref 65–99)
Potassium: 4.3 mmol/L (ref 3.5–5.2)
SODIUM: 141 mmol/L (ref 134–144)
Total Protein: 6.5 g/dL (ref 6.0–8.5)

## 2016-06-18 ENCOUNTER — Telehealth: Payer: Self-pay

## 2016-06-18 NOTE — Telephone Encounter (Signed)
LMTCB  Thanks,  -Joseline 

## 2016-06-18 NOTE — Telephone Encounter (Signed)
-----   Message from Mar Daring, [place] sent at 06/18/2016 12:30 PM EDT ----- All labs are within normal limits and stable.  Thanks! -JB

## 2016-06-19 NOTE — Telephone Encounter (Signed)
Pt returned call.  Thanks Con Memos

## 2016-06-19 NOTE — Telephone Encounter (Signed)
Patient advised as below.  

## 2016-06-19 NOTE — Telephone Encounter (Signed)
LMTCB

## 2016-06-20 ENCOUNTER — Other Ambulatory Visit: Payer: Self-pay | Admitting: Unknown Physician Specialty

## 2016-06-20 ENCOUNTER — Telehealth: Payer: Self-pay | Admitting: Physician Assistant

## 2016-06-20 DIAGNOSIS — R1010 Upper abdominal pain, unspecified: Secondary | ICD-10-CM

## 2016-06-20 NOTE — Telephone Encounter (Signed)
error 

## 2016-06-21 ENCOUNTER — Ambulatory Visit: Payer: BC Managed Care – PPO | Admitting: Physician Assistant

## 2016-06-21 ENCOUNTER — Ambulatory Visit
Admission: RE | Admit: 2016-06-21 | Discharge: 2016-06-21 | Disposition: A | Discharge status: Home / Self Care | Payer: BC Managed Care – PPO | Source: Ambulatory Visit | Attending: Unknown Physician Specialty | Admitting: Unknown Physician Specialty

## 2016-06-21 DIAGNOSIS — R1010 Upper abdominal pain, unspecified: Secondary | ICD-10-CM | POA: Insufficient documentation

## 2016-06-22 ENCOUNTER — Observation Stay
Admission: EM | Admit: 2016-06-22 | Discharge: 2016-06-24 | Disposition: A | Discharge status: Home / Self Care | Payer: BC Managed Care – PPO | Attending: General Surgery | Admitting: General Surgery

## 2016-06-22 ENCOUNTER — Encounter: Payer: Self-pay | Admitting: *Deleted

## 2016-06-22 ENCOUNTER — Emergency Department: Payer: BC Managed Care – PPO

## 2016-06-22 DIAGNOSIS — R1031 Right lower quadrant pain: Secondary | ICD-10-CM

## 2016-06-22 DIAGNOSIS — F329 Major depressive disorder, single episode, unspecified: Secondary | ICD-10-CM | POA: Diagnosis not present

## 2016-06-22 DIAGNOSIS — Z811 Family history of alcohol abuse and dependence: Secondary | ICD-10-CM | POA: Diagnosis not present

## 2016-06-22 DIAGNOSIS — F419 Anxiety disorder, unspecified: Secondary | ICD-10-CM | POA: Insufficient documentation

## 2016-06-22 DIAGNOSIS — R109 Unspecified abdominal pain: Secondary | ICD-10-CM | POA: Diagnosis present

## 2016-06-22 DIAGNOSIS — Z833 Family history of diabetes mellitus: Secondary | ICD-10-CM | POA: Diagnosis not present

## 2016-06-22 DIAGNOSIS — K353 Acute appendicitis with localized peritonitis, without perforation or gangrene: Secondary | ICD-10-CM

## 2016-06-22 DIAGNOSIS — Z809 Family history of malignant neoplasm, unspecified: Secondary | ICD-10-CM | POA: Insufficient documentation

## 2016-06-22 DIAGNOSIS — Z8249 Family history of ischemic heart disease and other diseases of the circulatory system: Secondary | ICD-10-CM | POA: Diagnosis not present

## 2016-06-22 DIAGNOSIS — Z818 Family history of other mental and behavioral disorders: Secondary | ICD-10-CM | POA: Insufficient documentation

## 2016-06-22 LAB — CBC
HEMATOCRIT: 39.8 % (ref 35.0–47.0)
HEMOGLOBIN: 13.9 g/dL (ref 12.0–16.0)
MCH: 32.6 pg (ref 26.0–34.0)
MCHC: 35 g/dL (ref 32.0–36.0)
MCV: 93.1 fL (ref 80.0–100.0)
Platelets: 208 10*3/uL (ref 150–440)
RBC: 4.28 MIL/uL (ref 3.80–5.20)
RDW: 12.4 % (ref 11.5–14.5)
WBC: 6.4 10*3/uL (ref 3.6–11.0)

## 2016-06-22 LAB — COMPREHENSIVE METABOLIC PANEL
ALK PHOS: 48 U/L (ref 38–126)
ALT: 15 U/L (ref 14–54)
ANION GAP: 7 (ref 5–15)
AST: 19 U/L (ref 15–41)
Albumin: 4.4 g/dL (ref 3.5–5.0)
BILIRUBIN TOTAL: 0.7 mg/dL (ref 0.3–1.2)
BUN: 15 mg/dL (ref 6–20)
CALCIUM: 9.4 mg/dL (ref 8.9–10.3)
CO2: 29 mmol/L (ref 22–32)
Chloride: 102 mmol/L (ref 101–111)
Creatinine, Ser: 0.64 mg/dL (ref 0.44–1.00)
Glucose, Bld: 94 mg/dL (ref 65–99)
POTASSIUM: 3.9 mmol/L (ref 3.5–5.1)
Sodium: 138 mmol/L (ref 135–145)
TOTAL PROTEIN: 7.3 g/dL (ref 6.5–8.1)

## 2016-06-22 LAB — URINALYSIS COMPLETE WITH MICROSCOPIC (ARMC ONLY)
BACTERIA UA: NONE SEEN
BILIRUBIN URINE: NEGATIVE
Glucose, UA: NEGATIVE mg/dL
HGB URINE DIPSTICK: NEGATIVE
LEUKOCYTES UA: NEGATIVE
NITRITE: NEGATIVE
PH: 7 (ref 5.0–8.0)
Protein, ur: NEGATIVE mg/dL
RBC / HPF: NONE SEEN RBC/hpf (ref 0–5)
SPECIFIC GRAVITY, URINE: 1.013 (ref 1.005–1.030)
WBC UA: NONE SEEN WBC/hpf (ref 0–5)

## 2016-06-22 LAB — POC URINE PREG, ED: PREG TEST UR: NEGATIVE

## 2016-06-22 LAB — LIPASE, BLOOD: Lipase: 25 U/L (ref 11–51)

## 2016-06-22 MED ORDER — IOPAMIDOL (ISOVUE-300) INJECTION 61%
100.0000 mL | Freq: Once | INTRAVENOUS | Status: AC | PRN
  Administered 2016-06-22: 100 mL via INTRAVENOUS
  Filled 2016-06-22: qty 100

## 2016-06-22 MED ORDER — ONDANSETRON 4 MG PO TBDP: 4.0000 mg | ORAL_TABLET | Freq: Four times a day (QID) | ORAL | Status: DC | PRN

## 2016-06-22 MED ORDER — KCL IN DEXTROSE-NACL 20-5-0.45 MEQ/L-%-% IV SOLN
INTRAVENOUS | Status: DC
  Administered 2016-06-22 – 2016-06-24 (×5): via INTRAVENOUS
  Filled 2016-06-22 (×9): qty 1000

## 2016-06-22 MED ORDER — ESCITALOPRAM OXALATE 10 MG PO TABS
20.0000 mg | ORAL_TABLET | Freq: Every day | ORAL | Status: DC
  Administered 2016-06-23 – 2016-06-24 (×2): 20 mg via ORAL
  Filled 2016-06-22 (×2): qty 2

## 2016-06-22 MED ORDER — IOPAMIDOL (ISOVUE-300) INJECTION 61%
30.0000 mL | Freq: Once | INTRAVENOUS | Status: AC | PRN
  Administered 2016-06-22: 30 mL via ORAL
  Filled 2016-06-22: qty 30

## 2016-06-22 MED ORDER — MORPHINE SULFATE (PF) 2 MG/ML IV SOLN
2.0000 mg | INTRAVENOUS | Status: DC | PRN
  Filled 2016-06-22: qty 1

## 2016-06-22 MED ORDER — ONDANSETRON HCL 4 MG/2ML IJ SOLN
4.0000 mg | Freq: Four times a day (QID) | INTRAMUSCULAR | Status: DC | PRN
  Administered 2016-06-23 (×3): 4 mg via INTRAVENOUS
  Filled 2016-06-22 (×3): qty 2

## 2016-06-22 MED ORDER — DIPHENHYDRAMINE HCL 50 MG/ML IJ SOLN: 25.0000 mg | Freq: Four times a day (QID) | INTRAMUSCULAR | Status: DC | PRN

## 2016-06-22 MED ORDER — ONDANSETRON HCL 4 MG/2ML IJ SOLN
4.0000 mg | Freq: Once | INTRAMUSCULAR | Status: AC
  Administered 2016-06-22: 4 mg via INTRAVENOUS
  Filled 2016-06-22: qty 2

## 2016-06-22 MED ORDER — MORPHINE SULFATE (PF) 2 MG/ML IV SOLN
2.0000 mg | Freq: Once | INTRAVENOUS | Status: AC
  Administered 2016-06-22: 2 mg via INTRAVENOUS
  Filled 2016-06-22: qty 1

## 2016-06-22 MED ORDER — DIPHENHYDRAMINE HCL 25 MG PO CAPS
25.0000 mg | ORAL_CAPSULE | Freq: Four times a day (QID) | ORAL | Status: DC | PRN
  Administered 2016-06-23: 25 mg via ORAL
  Filled 2016-06-22: qty 1

## 2016-06-22 MED ORDER — PIPERACILLIN-TAZOBACTAM 3.375 G IVPB
3.3750 g | Freq: Three times a day (TID) | INTRAVENOUS | Status: DC
  Administered 2016-06-22 – 2016-06-24 (×6): 3.375 g via INTRAVENOUS
  Filled 2016-06-22 (×6): qty 50

## 2016-06-22 MED ORDER — ALPRAZOLAM 0.25 MG PO TABS: 0.2500 mg | ORAL_TABLET | Freq: Every evening | ORAL | Status: DC | PRN

## 2016-06-22 NOTE — ED Provider Notes (Signed)
[facility] Emergency Department Provider Note ____________________________________________   I have reviewed the triage vital signs and the triage nursing note.  HISTORY  Chief Complaint Abdominal Pain   Historian Patient  HPI Valerie West is a 30 y.o. female presents today with mid and right lower quadrant abdominal pain which it sounds like started gradual onset waxing and waning but persistent since Tuesday. She saw her primary care physician who scheduled her for an outpatient ultrasound of the right upper quadrant and was told no gallstones. Yesterday she was asked to start on Prilosec. She was given referral for GI, with an appointment for December. She has not had any bad food or travel exposures. No fever. No bloody stool. No vomiting, diarrhea, or constipation. Initially she reports that the pain is mostly in the epigastrium and upper abdomen. Denies dysuria. Denies flank pain.  She does report being under a fair amount of stress, husband is been working out of town and she does have 2 children. No chest pain or trouble breathing.  Grandfather with irritable bowel, but no inflammatory bowel diseases that she knows of.    Past Medical History:  Diagnosis Date  . Anxiety   . Depression during pregnancy, antepartum unk    Patient Active Problem List   Diagnosis Date Noted  . Vitamin B12 deficiency 03/15/2015  . Migraine 02/14/2015  . Anxiety 01/27/2015  . Myofascial pain 01/27/2015  . Cervical muscle strain 01/27/2015  . Depression, postpartum 10/26/2013    Past Surgical History:  Procedure Laterality Date  . NO PAST SURGERIES      Prior to Admission medications   Medication Sig Start Date End Date Taking? Authorizing Provider  ALPRAZolam Duanne Moron) 0.25 MG tablet TAKE ONE TABLET DAILY BY MOUTH AS NEEDED FOR ANXIETY 04/09/16   Mar Daring, PA-C  escitalopram (LEXAPRO) 20 MG tablet TAKE 1 TABLET BY MOUTH EVERY DAY 09/27/15   Mar Daring, PA-C  Melatonin-Pyridoxine ([facility] MELATONIN) 5-10 MG TBCR Take by mouth.    Historical Provider, MD  rizatriptan (MAXALT) 10 MG tablet  05/01/16   Historical Provider, MD  TRI-PREVIFEM 0.18/0.215/0.25 MG-35 MCG tablet Take 1 tablet by mouth daily. 12/16/14   Historical Provider, MD  vitamin B-12 (CYANOCOBALAMIN) 1000 MCG tablet Take 1,000 mcg by mouth daily.    Historical Provider, MD    No Known Allergies  Family History  Problem Relation Age of Onset  . Depression Father   . Bipolar disorder Father   . Alcohol abuse Father   . Drug abuse Father   . Cancer Maternal Grandmother   . Diabetes Maternal Grandmother   . Diabetes Maternal Grandfather   . Cancer Maternal Grandfather   . Hypertension Mother     Social History Social History  Substance Use Topics  . Smoking status: Never Smoker  . Smokeless tobacco: Never Used  . Alcohol use 0.0 oz/week     Comment: occassional    Review of Systems  Constitutional: Negative for fever. Eyes: Negative for visual changes. ENT: Negative for sore throat. Cardiovascular: Negative for chest pain. Respiratory: Negative for shortness of breath. Gastrointestinal: Negative for vomiting and diarrhea. Genitourinary: Negative for dysuria. Musculoskeletal: Negative for back pain. Skin: Negative for rash. Neurological: Negative for headache. 10 point Review of Systems otherwise negative ____________________________________________   PHYSICAL EXAM:  VITAL SIGNS: ED Triage Vitals  Enc Vitals Group     BP 06/22/16 1239 116/66     Pulse Rate 06/22/16 1239 72  Resp 06/22/16 1239 20     Temp 06/22/16 1239 99.1 F (37.3 C)     Temp Source 06/22/16 1239 Oral     SpO2 06/22/16 1239 97 %     Weight 06/22/16 1240 140 lb (63.5 kg)     Height 06/22/16 1240 5\' 2"  (1.575 m)     Head Circumference --      Peak Flow --      Pain Score 06/22/16 1240 5     Pain Loc --      Pain Edu? --      Excl. in [place]? --      Constitutional:  Alert and oriented. Well appearing and in no distress. HEENT   Head: Normocephalic and atraumatic.      Eyes: Conjunctivae are normal. PERRL. Normal extraocular movements.      Ears:         Nose: No congestion/rhinnorhea.   Mouth/Throat: Mucous membranes are moist.   Neck: No stridor. Cardiovascular/Chest: Normal rate, regular rhythm.  No murmurs, rubs, or gallops. Respiratory: Normal respiratory effort without tachypnea nor retractions. Breath sounds are clear and equal bilaterally. No wheezes/rales/rhonchi. Gastrointestinal: Soft. No distention, no guarding, no rebound. Moderate tenderness to right lower quadrant palpation.  Genitourinary/rectal:Deferred Musculoskeletal: Nontender with normal range of motion in all extremities. No joint effusions.  No lower extremity tenderness.  No edema. Neurologic:  Normal speech and language. No gross or focal neurologic deficits are appreciated. Skin:  Skin is warm, dry and intact. No rash noted. Psychiatric: Mood and affect are normal. Speech and behavior are normal. Patient exhibits appropriate insight and judgment.   ____________________________________________  LABS (pertinent positives/negatives)  Labs Reviewed  URINALYSIS COMPLETEWITH MICROSCOPIC ([facility] ONLY) - Abnormal; Notable for the following:       Result Value   Color, Urine YELLOW (*)    APPearance CLEAR (*)    Ketones, ur 1+ (*)    Squamous Epithelial / LPF 0-5 (*)    All other components within normal limits  LIPASE, BLOOD  COMPREHENSIVE METABOLIC PANEL  CBC  POC URINE PREG, ED    ____________________________________________    EKG I, Lisa Roca, MD, the attending physician have personally viewed and interpreted all ECGs.  None ____________________________________________  RADIOLOGY All Xrays were viewed by me. Imaging interpreted by Radiologist.  CT abdomen and pelvis with contrast:  IMPRESSION: 1. The appendix is prominent/mildly dilated measuring  between 7 and 8 mm. It is thick walled proximally with mild mucosal enhancement. It is largely fluid-filled but there is air at the distal tip and a paucity of adjacent stranding. While not certain, these findings are concerning for early appendicitis. The patient's emergency room physician stated the pain is in the right lower quadrant. 2. Corpus luteum cyst in the left ovary. Free fluid in the pelvis is nonspecific but could be physiologic. 3. 19 mm nodule in the left lower lobe. Recommend a CT of the chest for better evaluation. Findings were called to Dr. Reita Cliche by myself. __________________________________________  PROCEDURES  Procedure(s) performed: None  Critical Care performed: None  ____________________________________________   ED COURSE / ASSESSMENT AND PLAN  Pertinent labs & imaging results that were available during my care of the patient were reviewed by me and considered in my medical decision making (see chart for details).   Ms. Benvenuto is here for evaluation of abdominal pain since Tuesday, on exam she has right lower quadrant tenderness to palpation more so than anywhere else and we discussed that at this point  anything gets important to CT the abdomen, she wishes to proceed. Denies GYN symptoms. Denies urinary symptoms, but I am going to add on the urinalysis and pregnancy test.  We did discuss that her laboratory studies were all reassuring including normal white blood cell count. However given no prior symptoms, and persistence of moderate symptoms to image the abdomen today. I was able to review the right carotid ultrasound was negative for gallstones.  Discussed CT scan result with the radiologist, and then discussed the result with the patient. I spoke with Dr. Pat Patrick, Gen. surgery who will come down to the ED for consult.   CONSULTATIONS:  Dr. Pat Patrick, general surgery for admission.   Patient / Family / Caregiver informed of clinical course, medical  decision-making process, and agree with plan.  ___________________________________________   FINAL CLINICAL IMPRESSION(S) / ED DIAGNOSES   Final diagnoses:  Acute appendicitis with localized peritonitis              Note: This dictation was prepared with Dragon dictation. Any transcriptional errors that result from this process are unintentional    Lisa Roca, MD 06/22/16 1759

## 2016-06-22 NOTE — ED Triage Notes (Signed)
Pt reports abdominal pain with nausea starting on Tuesday, pt was seen on Wednesday at [facility], ultrasound negative per pt, pt denies any other symptoms

## 2016-06-22 NOTE — H&P (Signed)
Patient ID: Valerie West, female   DOB: 1985-11-20, 30 y.o.   MRN: [id number]  CC: ABDOMINAL PAIN  HPI Valerie West is a 30 y.o. female who presents to the emergency department today for evaluation of right-sided abdominal pain. Patient reports she's had 4 days of right upper quadrant abdominal discomfort. She states the worst of her pain was actually 3 days prior to this presentation. She reports because she was continuing to have discomfort with nausea she was told to report to the emergency department by her primary care who is doing her workup. She states she's had intermittent nausea as well as intermittent right upper quadrant pain throughout the week. She denies any vomiting. She denies any fevers, chills, chest pain, shortness of breath, diarrhea, constipation. She states her last bowel movement was Tuesday which is normal for her. She's never had anything like this before. 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  HPI  Past Medical History:  Diagnosis Date  . Anxiety   . Depression during pregnancy, antepartum unk    Past Surgical History:  Procedure Laterality Date  . NO PAST SURGERIES      Family History  Problem Relation Age of Onset  . Depression Father   . Bipolar disorder Father   . Alcohol abuse Father   . Drug abuse Father   . Cancer Maternal Grandmother   . Diabetes Maternal Grandmother   . Diabetes Maternal Grandfather   . Cancer Maternal Grandfather   . Hypertension Mother     Social History Social History  Substance Use Topics  . Smoking status: Never Smoker  . Smokeless tobacco: Never Used  . Alcohol use 0.0 oz/week     Comment: occassional    No Known Allergies  Current Facility-Administered Medications  Medication Dose Route Frequency Provider Last Rate Last Dose  . ALPRAZolam (XANAX) tablet 0.25 mg  0.25 mg Oral QHS PRN Clayburn Pert, MD      . dextrose 5 % and 0.45  % NaCl with KCl 20 mEq/L infusion   Intravenous Continuous Clayburn Pert, MD      . diphenhydrAMINE (BENADRYL) capsule 25 mg  25 mg Oral Q6H PRN Clayburn Pert, MD       Or  . diphenhydrAMINE (BENADRYL) injection 25 mg  25 mg Intravenous Q6H PRN Clayburn Pert, MD      . escitalopram (LEXAPRO) tablet 20 mg  20 mg Oral Daily Clayburn Pert, MD      . morphine 2 MG/ML injection 2 mg  2 mg Intravenous Q4H PRN Clayburn Pert, MD      . ondansetron (ZOFRAN-ODT) disintegrating tablet 4 mg  4 mg Oral Q6H PRN Clayburn Pert, MD       Or  . ondansetron [facility]) injection 4 mg  4 mg Intravenous Q6H PRN Clayburn Pert, MD      . piperacillin-tazobactam (ZOSYN) IVPB 3.375 g  3.375 g Intravenous Q8H Clayburn Pert, MD         Review of Systems A Multi-point review of systems was asked and was negative except for the findings documented in the history of present illness  Physical Exam Blood pressure 128/75, pulse 79, temperature 98.8 F (37.1 C), temperature source Oral, resp. rate 16, height 5\' 2"  (1.575 m), weight 63.5 kg (140 lb), last menstrual period 05/30/2016, SpO2 100 %. CONSTITUTIONAL: Resting in bed in no acute distress. EYES: Pupils are equal, round, and reactive to  light, Sclera are non-icteric. EARS, NOSE, MOUTH AND THROAT: The oropharynx is clear. The oral mucosa is pink and moist. Hearing is intact to voice. LYMPH NODES:  Lymph nodes in the neck are normal. RESPIRATORY:  Lungs are clear. There is normal respiratory effort, with equal breath sounds bilaterally, and without pathologic use of accessory muscles. CARDIOVASCULAR: Heart is regular without murmurs, gallops, or rubs. GI: The abdomen is soft, mildly tender in the right lower quadrant, and nondistended. There is no McBurney's, Rovsing's, psoas sign. No rebounding and no guarding. There are no palpable masses. There is no hepatosplenomegaly. There are normal bowel sounds in all quadrants. GU: Rectal deferred.    MUSCULOSKELETAL: Normal muscle strength and tone. No cyanosis or edema.   SKIN: Turgor is good and there are no pathologic skin lesions or ulcers. NEUROLOGIC: Motor and sensation is grossly normal. Cranial nerves are grossly intact. PSYCH:  Oriented to person, place and time. Affect is normal.  Data Reviewed Images and labs reviewed. Her labs are all within normal limits. White blood cell count 6.4, all of her E lites are normal. Her urinalysis is normal. CT scan is reviewed which does appear to show a mildly dilated fluid-filled appendix. There is no real periappendiceal stranding however does have fluid within the appendix with some mucosal enhancement. I have personally reviewed the patient's imaging, laboratory findings and medical records.    Assessment    Abdominal pain    Plan    30 year old female who presents with right-sided abdominal pain. Four-day history of pain with actually some improvement over the last 48 hours. CT scan interpreted as possible early appendicitis. Counseled the patient extensively as to CT findings as well as whether or not she truly has appendicitis being difficult to ascertain. Offered patient a diagnostic laparoscopy with appendectomy. Patient is unsure whether or not she wishes to proceed. She is willing to accept overnight observation with serial exams. Should her pain failed to resolve overnight with antibiotics she will except appendectomy. Should her pain worsen she'll except appendectomy. However should her pain completely resolved would likely recommend trial of antibiotics and set of surgery. Patient voiced understanding. Plan for observation, IV fluids, IV antibiotics, nothing by mouth and as needed medications. Labs and examination to be repeated during hospital stay.     Time spent with the patient was 45 minutes, with more than 50% of the time spent in face-to-face education, counseling and care coordination.     Clayburn Pert, MD  FACS General Surgeon 06/22/2016, 9:29 PM

## 2016-06-23 ENCOUNTER — Encounter
Admission: EM | Disposition: A | Discharge status: Home / Self Care | Payer: Self-pay | Source: Home / Self Care | Attending: Emergency Medicine

## 2016-06-23 ENCOUNTER — Observation Stay: Payer: BC Managed Care – PPO | Admitting: Anesthesiology

## 2016-06-23 DIAGNOSIS — R1031 Right lower quadrant pain: Secondary | ICD-10-CM

## 2016-06-23 DIAGNOSIS — K353 Acute appendicitis with localized peritonitis, without perforation or gangrene: Secondary | ICD-10-CM

## 2016-06-23 HISTORY — PX: LAPAROSCOPIC APPENDECTOMY: SHX408

## 2016-06-23 LAB — BASIC METABOLIC PANEL
ANION GAP: 6 (ref 5–15)
BUN: 13 mg/dL (ref 6–20)
CALCIUM: 8.5 mg/dL — AB (ref 8.9–10.3)
CO2: 25 mmol/L (ref 22–32)
Chloride: 105 mmol/L (ref 101–111)
Creatinine, Ser: 0.71 mg/dL (ref 0.44–1.00)
GFR calc Af Amer: >60 mL/min (ref >60)
GLUCOSE: 153 mg/dL — AB (ref 65–99)
Potassium: 3.8 mmol/L (ref 3.5–5.1)
Sodium: 136 mmol/L (ref 135–145)

## 2016-06-23 LAB — CBC
HEMATOCRIT: 37.8 % (ref 35.0–47.0)
Hemoglobin: 13.4 g/dL (ref 12.0–16.0)
MCH: 33.1 pg (ref 26.0–34.0)
MCHC: 35.5 g/dL (ref 32.0–36.0)
MCV: 93.1 fL (ref 80.0–100.0)
PLATELETS: 186 10*3/uL (ref 150–440)
RBC: 4.06 MIL/uL (ref 3.80–5.20)
RDW: 12.3 % (ref 11.5–14.5)
WBC: 10.6 10*3/uL (ref 3.6–11.0)

## 2016-06-23 LAB — MRSA PCR SCREENING: MRSA by PCR: NEGATIVE

## 2016-06-23 SURGERY — APPENDECTOMY, LAPAROSCOPIC
Anesthesia: General

## 2016-06-23 MED ORDER — LIDOCAINE HCL 1 % IJ SOLN
INTRAMUSCULAR | Status: DC | PRN
  Administered 2016-06-23: 8 mL

## 2016-06-23 MED ORDER — LACTATED RINGERS IV SOLN
INTRAVENOUS | Status: DC | PRN
  Administered 2016-06-23: via INTRAVENOUS

## 2016-06-23 MED ORDER — HYDROCODONE-ACETAMINOPHEN 5-325 MG PO TABS: 1.0000 | ORAL_TABLET | ORAL | 0 refills | Status: DC | PRN

## 2016-06-23 MED ORDER — SUMATRIPTAN SUCCINATE 50 MG PO TABS
50.0000 mg | ORAL_TABLET | ORAL | Status: DC | PRN
  Administered 2016-06-23: 50 mg via ORAL
  Filled 2016-06-23: qty 1

## 2016-06-23 MED ORDER — PROPOFOL 10 MG/ML IV BOLUS
INTRAVENOUS | Status: DC | PRN
  Administered 2016-06-23: 120 mg via INTRAVENOUS

## 2016-06-23 MED ORDER — FENTANYL CITRATE (PF) 100 MCG/2ML IJ SOLN
INTRAMUSCULAR | Status: DC | PRN
  Administered 2016-06-23: 100 ug via INTRAVENOUS

## 2016-06-23 MED ORDER — ONDANSETRON HCL 4 MG/2ML IJ SOLN
INTRAMUSCULAR | Status: AC
  Filled 2016-06-23: qty 2

## 2016-06-23 MED ORDER — FENTANYL CITRATE (PF) 100 MCG/2ML IJ SOLN
25.0000 ug | INTRAMUSCULAR | Status: AC | PRN
  Administered 2016-06-23 (×6): 25 ug via INTRAVENOUS

## 2016-06-23 MED ORDER — LIDOCAINE HCL (CARDIAC) 20 MG/ML IV SOLN
INTRAVENOUS | Status: DC | PRN
  Administered 2016-06-23: 100 mg via INTRAVENOUS

## 2016-06-23 MED ORDER — DEXAMETHASONE SODIUM PHOSPHATE 10 MG/ML IJ SOLN
INTRAMUSCULAR | Status: DC | PRN
  Administered 2016-06-23: 10 mg via INTRAVENOUS

## 2016-06-23 MED ORDER — FENTANYL CITRATE (PF) 100 MCG/2ML IJ SOLN
INTRAMUSCULAR | Status: AC
  Filled 2016-06-23: qty 2

## 2016-06-23 MED ORDER — ONDANSETRON HCL 4 MG/2ML IJ SOLN
INTRAMUSCULAR | Status: DC | PRN
  Administered 2016-06-23: 4 mg via INTRAVENOUS

## 2016-06-23 MED ORDER — SODIUM CHLORIDE 0.9 % IR SOLN
Status: DC | PRN
  Administered 2016-06-23: 100 mL

## 2016-06-23 MED ORDER — PHENYLEPHRINE HCL 10 MG/ML IJ SOLN
INTRAMUSCULAR | Status: DC | PRN
  Administered 2016-06-23: 100 ug via INTRAVENOUS
  Administered 2016-06-23: 50 ug via INTRAVENOUS

## 2016-06-23 MED ORDER — BUPIVACAINE-EPINEPHRINE (PF) 0.25% -1:200000 IJ SOLN
INTRAMUSCULAR | Status: DC | PRN
  Administered 2016-06-23: 8 mL via PERINEURAL

## 2016-06-23 MED ORDER — HYDROCODONE-ACETAMINOPHEN 5-325 MG PO TABS
1.0000 | ORAL_TABLET | ORAL | Status: DC | PRN
  Administered 2016-06-23 (×3): 2 via ORAL
  Administered 2016-06-24: 1 via ORAL
  Filled 2016-06-23 (×5): qty 2

## 2016-06-23 MED ORDER — SUGAMMADEX SODIUM 500 MG/5ML IV SOLN
INTRAVENOUS | Status: DC | PRN
  Administered 2016-06-23: 250 mg via INTRAVENOUS

## 2016-06-23 MED ORDER — ONDANSETRON HCL 4 MG/2ML IJ SOLN
4.0000 mg | Freq: Once | INTRAMUSCULAR | Status: AC | PRN
  Administered 2016-06-23: 4 mg via INTRAVENOUS

## 2016-06-23 MED ORDER — SUCCINYLCHOLINE CHLORIDE 20 MG/ML IJ SOLN
INTRAMUSCULAR | Status: DC | PRN
  Administered 2016-06-23: 80 mg via INTRAVENOUS

## 2016-06-23 MED ORDER — ROCURONIUM BROMIDE 100 MG/10ML IV SOLN
INTRAVENOUS | Status: DC | PRN
  Administered 2016-06-23: 40 mg via INTRAVENOUS

## 2016-06-23 SURGICAL SUPPLY — 42 items
ADHESIVE MASTISOL STRL (MISCELLANEOUS) ×3 IMPLANT
APPLIER CLIP 5 13 M/L LIGAMAX5 (MISCELLANEOUS) ×3
BLADE SURG SZ11 CARB STEEL (BLADE) ×3 IMPLANT
BULB RESERV EVAC DRAIN JP 100C (MISCELLANEOUS) IMPLANT
CANISTER SUCT 1200ML W/VALVE (MISCELLANEOUS) ×3 IMPLANT
CATH TRAY 16F METER LATEX (MISCELLANEOUS) ×3 IMPLANT
CHLORAPREP W/TINT 26ML (MISCELLANEOUS) ×3 IMPLANT
CLIP APPLIE 5 13 M/L LIGAMAX5 (MISCELLANEOUS) ×1 IMPLANT
CLOSURE WOUND 1/2 X4 (GAUZE/BANDAGES/DRESSINGS) ×1
CUTTER FLEX LINEAR 45M (STAPLE) ×3 IMPLANT
DRAIN CHANNEL JP 19F (MISCELLANEOUS) IMPLANT
DRESSING TELFA 4X3 1S ST N-ADH (GAUZE/BANDAGES/DRESSINGS) ×3 IMPLANT
DRSG TEGADERM 2-3/8X2-3/4 SM (GAUZE/BANDAGES/DRESSINGS) ×9 IMPLANT
DRSG TEGADERM 2X2.25 PEDS (GAUZE/BANDAGES/DRESSINGS) ×3 IMPLANT
DRSG TELFA 3X8 NADH (GAUZE/BANDAGES/DRESSINGS) ×3 IMPLANT
ELECT REM PT RETURN 9FT ADLT (ELECTROSURGICAL) ×3
ELECTRODE REM PT RTRN 9FT ADLT (ELECTROSURGICAL) ×1 IMPLANT
ENDOPOUCH RETRIEVER 10 (MISCELLANEOUS) ×3 IMPLANT
GLOVE BIO SURGEON STRL SZ7.5 (GLOVE) ×12 IMPLANT
GLOVE INDICATOR 8.0 STRL GRN (GLOVE) ×3 IMPLANT
GOWN STRL REUS W/ TWL LRG LVL3 (GOWN DISPOSABLE) ×2 IMPLANT
GOWN STRL REUS W/TWL LRG LVL3 (GOWN DISPOSABLE) ×4
IRRIGATION STRYKERFLOW (MISCELLANEOUS) ×1 IMPLANT
IRRIGATOR STRYKERFLOW (MISCELLANEOUS) ×3
IV NS 1000ML (IV SOLUTION) ×2
IV NS 1000ML BAXH (IV SOLUTION) ×1 IMPLANT
KIT RM TURNOVER STRD PROC AR (KITS) ×3 IMPLANT
LABEL OR SOLS (LABEL) IMPLANT
NEEDLE HYPO 25X1 1.5 SAFETY (NEEDLE) ×3 IMPLANT
NEEDLE VERESS 14GA 120MM (NEEDLE) ×3 IMPLANT
NS IRRIG 500ML POUR BTL (IV SOLUTION) ×3 IMPLANT
PACK LAP CHOLECYSTECTOMY (MISCELLANEOUS) ×3 IMPLANT
RELOAD 45 VASCULAR/THIN (ENDOMECHANICALS) ×3 IMPLANT
RELOAD STAPLE TA45 3.5 REG BLU (ENDOMECHANICALS) ×3 IMPLANT
SLEEVE ENDOPATH XCEL 5M (ENDOMECHANICALS) ×3 IMPLANT
STRIP CLOSURE SKIN 1/2X4 (GAUZE/BANDAGES/DRESSINGS) ×2 IMPLANT
SUT MNCRL 4-0 (SUTURE) ×2
SUT MNCRL 4-0 27XMFL (SUTURE) ×1
SUTURE MNCRL 4-0 27XMF (SUTURE) ×1 IMPLANT
TROCAR XCEL 12X100 BLDLESS (ENDOMECHANICALS) ×3 IMPLANT
TROCAR XCEL NON-BLD 5MMX100MML (ENDOMECHANICALS) ×3 IMPLANT
TUBING INSUFFLATOR HI FLOW (MISCELLANEOUS) ×3 IMPLANT

## 2016-06-23 NOTE — Progress Notes (Signed)
She still very nauseated and has moderate abdominal pain. She cannot tolerate clear liquids very well. We'll continue her IV fluid is simply observe her distention.

## 2016-06-23 NOTE — Anesthesia Preprocedure Evaluation (Signed)
Anesthesia Evaluation  Patient identified by MRN, date of birth, ID band Patient awake    Reviewed: Allergy & Precautions, NPO status , Patient's Chart, lab work & pertinent test results  History of Anesthesia Complications Negative for: history of anesthetic complications  Airway Mallampati: II       Dental   Pulmonary neg pulmonary ROS,           Cardiovascular negative cardio ROS       Neuro/Psych Anxiety Depression    GI/Hepatic negative GI ROS, Neg liver ROS,   Endo/Other  negative endocrine ROS  Renal/GU negative Renal ROS     Musculoskeletal   Abdominal   Peds  Hematology negative hematology ROS (+)   Anesthesia Other Findings   Reproductive/Obstetrics                             Anesthesia Physical Anesthesia Plan  ASA: II and emergent  Anesthesia Plan: General   Post-op Pain Management:    Induction: Intravenous  Airway Management Planned: Oral ETT  Additional Equipment:   Intra-op Plan:   Post-operative Plan:   Informed Consent: I have reviewed the patients History and Physical, chart, labs and discussed the procedure including the risks, benefits and alternatives for the proposed anesthesia with the patient or authorized representative who has indicated his/her understanding and acceptance.     Plan Discussed with:   Anesthesia Plan Comments:         Anesthesia Quick Evaluation

## 2016-06-23 NOTE — Transfer of Care (Signed)
Immediate Anesthesia Transfer of Care Note  Patient: Valerie West  Procedure(s) Performed: Procedure(s): APPENDECTOMY LAPAROSCOPIC (N/A)  Patient Location: PACU  Anesthesia Type:General  Level of Consciousness: awake  Airway & Oxygen Therapy: Patient connected to face mask oxygen  Post-op Assessment: Post -op Vital signs reviewed and stable  Post vital signs: stable  Last Vitals:  Vitals:   06/22/16 2027 06/23/16 0134  BP: 128/75 102/67  Pulse: 79 100  Resp: 16 (!) 21  Temp: 37.1 C     Last Pain:  Vitals:   06/22/16 2029  TempSrc:   PainSc: 3       Patients Stated Pain Goal: 0 ([id number])  Complications: No apparent anesthesia complications

## 2016-06-23 NOTE — Anesthesia Procedure Notes (Signed)
Procedure Name: Intubation Date/Time: 06/23/2016 12:40 AM Performed by: Aline Brochure Pre-anesthesia Checklist: Patient identified, Emergency Drugs available, Suction available and Patient being monitored Patient Re-evaluated:Patient Re-evaluated prior to inductionOxygen Delivery Method: Circle system utilized Preoxygenation: Pre-oxygenation with 100% oxygen Intubation Type: IV induction, Rapid sequence and Cricoid Pressure applied Laryngoscope Size: Mac and 3 Grade View: Grade I Tube type: Oral Tube size: 7.0 mm Number of attempts: 1 Airway Equipment and Method: Stylet Placement Confirmation: ETT inserted through vocal cords under direct vision,  positive ETCO2 and breath sounds checked- equal and bilateral Secured at: 20 cm Tube secured with: Tape Dental Injury: Teeth and Oropharynx as per pre-operative assessment

## 2016-06-23 NOTE — Op Note (Signed)
laparascopic appendectomy   Valerie West Date of operation:  06/23/2016  Indications: The patient presented with a history of  abdominal pain. Workup has revealed findings consistent with acute appendicitis.  Pre-operative Diagnosis: Abdominal pain, right lower quadrant  Post-operative Diagnosis: Acute appendicitis without mention of peritonitis  Surgeon: Juanda Crumble T. Adonis Huguenin, MD, FACS  Anesthesia: General with endotracheal tube  Procedure Details  The patient was seen again in the preop area. The options of surgery versus observation were reviewed with the patient and/or family. The risks of bleeding, infection, recurrence of symptoms, negative laparoscopy, potential for an open procedure, bowel injury, abscess or infection, were all reviewed as well. The patient was taken to Operating Room, identified as Valerie West and the procedure verified as laparoscopic appendectomy. A Time Out was held and the above information confirmed.  The patient was placed in the supine position and general anesthesia was induced.  Antibiotic prophylaxis was administered and VT E prophylaxis was in place. A Foley catheter was placed by the nursing staff.   The abdomen was prepped and draped in a sterile fashion. An infraumbilical incision was made. A Veress needle was placed and pneumoperitoneum was obtained. A 5 mm Optiview trocar port was placed without difficulty and the abdominal cavity was explored.  Under direct vision a 5 mm suprapubic port was placed and a 12 mm left lateral port was placed all under direct vision.  The appendix was identified and found to be acutely inflamed with inflammatory attachments to the lateral sidewall pelvis. These were able be bluntly lysed allowing the appendix was carefully dissected. Using a [employer] a window was created at the base of the appendix. The base of the appendix was dissected out and divided with a standard load Endo GIA. The mesoappendix was divided  with a vascular load Endo GIA. There was noted continued bleeding after firing the vascular load which was made hemostatic with a clip applier.  The appendix was passed out through the left lateral port site with the aid of an Endo Catch bag. The right lower quadrant and pelvis was then irrigated with copious amounts of normal saline which was aspirated. Inspection failed to identify any additional bleeding and there were no signs of bowel injury.  Again the right lower quadrant was inspected there was no sign of bleeding or bowel injury therefore pneumoperitoneum was released, all ports were removed, the fascia of the left lateral port was closed with a figure-of-eight 2-0 Vicryl suture, and the skin incisions were approximated with subcuticular 4-0 Monocryl. Steri-Strips and Mastisol and sterile dressings were placed.  The patient tolerated the procedure well, there were no complications. The sponge lap and needle count were correct at the end of the procedure.  The patient was taken to the recovery room in stable condition to be admitted for continued care.  Findings: Acute appendicitis  Estimated Blood Loss: 10 mL                  Specimens: appendix         Complications:  None                  Clayburn Pert MD, FACS

## 2016-06-23 NOTE — Discharge Summary (Signed)
Patient ID: Valerie West MRN: [id number] DOB/AGE: 02-12-1986 30 y.o.  Admit date: 06/22/2016 Discharge date: 06/23/2016  Discharge Diagnoses:  Appendicitis  Procedures Performed: Laparoscopic appendectomy  Discharged Condition: good  Hospital Course: Patient brought in the hospital under observation from the emergency department for appendicitis. She went to the operating room for an uneventful laparoscopic appendectomy. She had resolution of her right-sided abdominal pain after the surgery. She was tolerating a diet and have her pain controlled oral medications prior to discharge.  Discharge Orders:  discharge home  Disposition: 01-Home or Self Care  Discharge Medications:   Medication List    TAKE these medications   ALPRAZolam 0.25 MG tablet Commonly known as:  XANAX TAKE ONE TABLET DAILY BY MOUTH AS NEEDED FOR ANXIETY   [facility] MELATONIN 5-10 MG Tbcr Generic drug:  Melatonin-Pyridoxine Take by mouth.   escitalopram 20 MG tablet Commonly known as:  LEXAPRO TAKE 1 TABLET BY MOUTH EVERY DAY   HYDROcodone-acetaminophen 5-325 MG tablet Commonly known as:  NORCO/VICODIN Take 1-2 tablets by mouth every 4 (four) hours as needed for moderate pain or severe pain.   rizatriptan 10 MG tablet Commonly known as:  MAXALT   TRI-PREVIFEM 0.18/0.215/0.25 MG-35 MCG tablet Generic drug:  Norgestimate-Ethinyl Estradiol Triphasic Take 1 tablet by mouth daily.   vitamin B-12 1000 MCG tablet Commonly known as:  CYANOCOBALAMIN Take 1,000 mcg by mouth daily.        Follwup: Follow-up [place]. Schedule an appointment as soon as possible for a visit in 2 week(s).   Specialty:  General Surgery Why:  for post op Contact information: [address] [place] [phone]          Signed: Clayburn Pert 06/23/2016, 6:42 AM

## 2016-06-23 NOTE — Anesthesia Postprocedure Evaluation (Signed)
Anesthesia Post Note  Patient: Valerie West  Procedure(s) Performed: Procedure(s) (LRB): APPENDECTOMY LAPAROSCOPIC (N/A)  Patient location during evaluation: PACU Anesthesia Type: General Level of consciousness: awake and alert Pain management: pain level controlled Vital Signs Assessment: post-procedure vital signs reviewed and stable Respiratory status: spontaneous breathing and respiratory function stable Cardiovascular status: stable Anesthetic complications: no    Last Vitals:  Vitals:   06/23/16 0134 06/23/16 0135  BP: 102/67 102/67  Pulse: 100 97  Resp: (!) 21 16  Temp: (P) 36.2 C     Last Pain:  Vitals:   06/22/16 2029  TempSrc:   PainSc: 3                  Brentyn Seehafer K

## 2016-06-23 NOTE — Brief Op Note (Signed)
06/22/2016 - 06/23/2016  1:29 AM  PATIENT:  Valerie West  30 y.o. female  PRE-OPERATIVE DIAGNOSIS:  appendicitis  POST-OPERATIVE DIAGNOSIS:  appendicitis  PROCEDURE:  Procedure(s): APPENDECTOMY LAPAROSCOPIC (N/A)  SURGEON:  Surgeon(s) and Role:    * Clayburn Pert, MD - Primary  PHYSICIAN ASSISTANT:   ASSISTANTS: none   ANESTHESIA:   general  EBL:  Total I/O In: -  Out: 245 [Urine:240; Blood:5]  BLOOD ADMINISTERED:none  DRAINS: none   LOCAL MEDICATIONS USED:  MARCAINE   , XYLOCAINE  and Amount: 16 ml  SPECIMEN:  Source of Specimen:  appendix  DISPOSITION OF SPECIMEN:  PATHOLOGY  COUNTS:  YES  TOURNIQUET:  * No tourniquets in log *  DICTATION: .Dragon Dictation  PLAN OF CARE: Admit for overnight observation  PATIENT DISPOSITION:  PACU - hemodynamically stable.   Delay start of Pharmacological VTE agent (>24hrs) due to surgical blood loss or risk of bleeding: not applicable

## 2016-06-24 NOTE — Progress Notes (Signed)
Pt d/c home; d/c instructions reviewed w/ pt; pt understanding was verbalized; IV removed, catheter in tact, gauze dressing applied; all pt questions answered; pt verbalized that all pt belongings were accounted for; pt left unit via wheelchair accompanied by staff 

## 2016-06-24 NOTE — Discharge Instructions (Signed)
Laparoscopic Appendectomy, Adult, Care After Refer to this sheet in the next few weeks. These instructions provide you with information on caring for yourself after your procedure. Your caregiver may also give you more specific instructions. Your treatment has been planned according to current medical practices, but problems sometimes occur. Call your caregiver if you have any problems or questions after your procedure. HOME CARE INSTRUCTIONS  Do not drive while taking narcotic pain medicines.  Use stool softener if you become constipated from your pain medicines.  Change your bandages (dressings) as directed. Daily until all drainage stops  Keep your wounds clean and dry. You may wash the wounds gently with soap and water. Gently pat the wounds dry with a clean towel.  Do not take baths, swim, or use hot tubs for 10 days, or as instructed by your caregiver. Ok to shower in 24 hours  Only take over-the-counter or prescription medicines for pain, discomfort, or fever as directed by your caregiver.  You may continue your normal diet as directed.  Do not lift more than 10 pounds (4.5 kg) or play contact sports for 3 weeks, or as directed.  Slowly increase your activity after surgery.  Take deep breaths to avoid getting a lung infection (pneumonia). SEEK MEDICAL CARE IF:  You have redness, swelling, or increasing pain in your wounds.  You have pus coming from your wounds.  You have drainage from a wound that lasts longer than 1 day.  You notice a bad smell coming from the wounds or dressing.  Your wound edges break open after stitches (sutures) have been removed.  You notice increasing pain in the shoulders (shoulder strap areas) or near your shoulder blades.  You develop dizzy episodes or fainting while standing.  You develop shortness of breath.  You develop persistent nausea or vomiting.  You cannot control your bowel functions or lose your appetite.  You develop  diarrhea. SEEK IMMEDIATE MEDICAL CARE IF:   You have a fever.  You develop a rash.  You have difficulty breathing or sharp pains in your chest.  You develop any reaction or side effects to medicines given. MAKE SURE YOU:  Understand these instructions.  Will watch your condition.  Will get help right away if you are not doing well or get worse.   This information is not intended to replace advice given to you by your health care provider. Make sure you discuss any questions you have with your health care provider.   Document Released: 08/06/2005 Document Revised: 12/21/2014 Document Reviewed: 01/24/2015 Elsevier Interactive Patient Education 2016 [employer].    Follow all MD discharge instructions. Take all medications as prescribed. Keep all follow up appointments. If your symptoms return, call your doctor. If you experience any new symptoms that are of concern to you or that are bothersome to you, call your doctor. For all questions and/or concerns, call your doctor.  If you experience any bleeding or discharge from your incision sites, redness or swelling at your incision sites, fever, pain uncontrolled by medication, increase in pain, or any nausea, call your doctor.    If you have a medical emergency, call 911

## 2016-06-25 ENCOUNTER — Encounter: Payer: Self-pay | Admitting: General Surgery

## 2016-06-26 LAB — SURGICAL PATHOLOGY

## 2016-06-28 ENCOUNTER — Telehealth: Payer: Self-pay

## 2016-06-28 NOTE — Telephone Encounter (Signed)
Patient called in and wants to return to work on Monday, 07/02/16. I explained to patient that she may return to work on Monday as long as she is not taking narcotic pain medication and she is abiding to 15 pound weight restriction.   She verbalizes understanding and will call back if she needs a note from her employer, prior to her Wednesday 07/04/16 post-op appt.

## 2016-07-02 NOTE — Telephone Encounter (Signed)
Pt called stating that her employer needs a letter saying she can go to work today 07/02/16. Letter can be faxed to [phone], attention to [facility].

## 2016-07-02 NOTE — Telephone Encounter (Signed)
Patient's note has been written and faxed with positive confirmation at this time to [phone].

## 2016-07-04 ENCOUNTER — Encounter: Payer: Self-pay | Admitting: Surgery

## 2016-07-04 ENCOUNTER — Ambulatory Visit (INDEPENDENT_AMBULATORY_CARE_PROVIDER_SITE_OTHER): Payer: BC Managed Care – PPO | Admitting: Surgery

## 2016-07-04 VITALS — BP 112/76 | HR 80 | Temp 98.3°F | Ht 63.0 in | Wt 138.0 lb

## 2016-07-04 DIAGNOSIS — K353 Acute appendicitis with localized peritonitis, without perforation or gangrene: Secondary | ICD-10-CM

## 2016-07-04 NOTE — Progress Notes (Signed)
Outpatient Surgical Follow Up  07/04/2016  Valerie West is an 30 y.o. female seen for the diagnosis of Acute appendicitis with localized peritonitis [K35.3].  HPI: Patient seen and examined in clinic. She had laparoscopic appendectomy on 11/4 with Dr. Adonis Huguenin. Overall she is doing well with no acute complaints. she denies N/V, D/C, fevers or malaise. She was able to go to [employer] with her family. She has been back to work and does state that she is quite fatigued but otherwise is been doing well.  Past Medical History:  Diagnosis Date  . Anxiety   . Depression during pregnancy, antepartum unk    Past Surgical History:  Procedure Laterality Date  . LAPAROSCOPIC APPENDECTOMY N/A 06/23/2016   Procedure: APPENDECTOMY LAPAROSCOPIC;  Surgeon: Clayburn Pert, MD;  Location: [facility] ORS;  Service: General;  Laterality: N/A;  . NO PAST SURGERIES      Family History  Problem Relation Age of Onset  . Depression Father   . Bipolar disorder Father   . Alcohol abuse Father   . Drug abuse Father   . Cancer Maternal Grandmother   . Diabetes Maternal Grandmother   . Diabetes Maternal Grandfather   . Cancer Maternal Grandfather   . Hypertension Mother     Social History:  reports that she has never smoked. She has never used smokeless tobacco. She reports that she drinks alcohol. She reports that she does not use drugs.  Allergies: No Known Allergies  Medications reviewed.  Physical Exam:  BP 112/76   Pulse 80   Temp 98.3 F (36.8 C) (Oral)   Ht 5\' 3"  (1.6 m)   Wt 138 lb (62.6 kg)   LMP 05/30/2016   BMI 24.45 kg/m   Gen: patient resting comfortably in clinic, no cardiovascular or respiratory distress Abd/GI: soft, minimal tenderness in Right rectus muscle with leg raise, incisions c/d/i, steri strips removed, no erythema or drainage  Assessment/Plan: Valerie West is an 31 y.o. female seen for the diagnosis of Acute appendicitis with localized peritonitis [K35.3].  Progressing as expected. She was instructed that the fatigue and appetite should improve over time. She was also instructed on ice, ibuprofen or heating packs for help with the muscle strain in the right abdomen. Patient is to call with any questions or concerns.  Dezi Brauner L. Gwendelyn Lanting MD General Surgeon  07/04/2016,2:30 PM

## 2016-07-04 NOTE — Patient Instructions (Signed)
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
## 2016-10-24 ENCOUNTER — Encounter: Payer: Self-pay | Admitting: Physician Assistant

## 2016-10-24 ENCOUNTER — Telehealth: Payer: Self-pay | Admitting: Physician Assistant

## 2016-10-24 ENCOUNTER — Ambulatory Visit (INDEPENDENT_AMBULATORY_CARE_PROVIDER_SITE_OTHER): Payer: BC Managed Care – PPO | Admitting: Physician Assistant

## 2016-10-24 VITALS — BP 90/60 | HR 73 | Temp 98.2°F | Resp 16 | Wt 140.2 lb

## 2016-10-24 DIAGNOSIS — T148XXA Other injury of unspecified body region, initial encounter: Secondary | ICD-10-CM | POA: Diagnosis not present

## 2016-10-24 DIAGNOSIS — R911 Solitary pulmonary nodule: Secondary | ICD-10-CM | POA: Diagnosis not present

## 2016-10-24 DIAGNOSIS — IMO0001 Reserved for inherently not codable concepts without codable children: Secondary | ICD-10-CM

## 2016-10-24 MED ORDER — PREDNISONE 10 MG (21) PO TBPK: ORAL_TABLET | ORAL | 0 refills | Status: DC

## 2016-10-24 NOTE — Patient Instructions (Signed)
Muscle Cramps and Spasms  Muscle cramps and spasms occur when a muscle or muscles tighten and you have no control over this tightening (involuntary muscle contraction). They are a common problem and can develop in any muscle. The most common place is in the calf muscles of the leg. Muscle cramps and muscle spasms are both involuntary muscle contractions, but there are some differences between the two:  · Muscle cramps are painful. They come and go and may last a few seconds to 15 minutes. Muscle cramps are often more forceful and last longer than muscle spasms.  · Muscle spasms may or may not be painful. They may also last just a few seconds or much longer.    Certain medical conditions, such as diabetes or Parkinson disease, can make it more likely to develop cramps or spasms. However, cramps or spasms are usually not caused by a serious underlying problem. Common causes include:  · Overexertion.  · Overuse from repetitive motions, or doing the same thing over and over.  · Remaining in a certain position for a long period of time.  · Improper preparation, form, or technique while playing a sport or doing an activity.  · Dehydration.  · Injury.  · Side effects of some medicines.  · Abnormally low levels of the salts and ions in your blood (electrolytes), especially potassium and calcium. This could happen if you are taking water pills (diuretics) or if you are pregnant.    In many cases, the cause of muscle cramps or spasms is unknown.  Follow these instructions at home:  · Stay well hydrated. Drink enough fluid to keep your urine clear or pale yellow.  · Try massaging, stretching, and relaxing the affected muscle.  · If directed, apply heat to tight or tense muscles as often as told by your health care provider. Use the heat source that your health care provider recommends, such as a moist heat pack or a heating pad.  ? Place a towel between your skin and the heat source.  ? Leave the heat on for 20-30  minutes.  ? Remove the heat if your skin turns bright red. This is especially important if you are unable to feel pain, heat, or cold. You may have a greater risk of getting burned.  · If directed, put ice on the affected area. This may help if you are sore or have pain after a cramp or spasm.  ? Put ice in a plastic bag.  ? Place a towel between your skin and the bag.  ? Leave the ice on for 20 minutes, 2-3 times a day.  · Take over-the-counter and prescription medicines only as told by your health care provider.  · Pay attention to any changes in your symptoms.  Contact a health care provider if:  · Your cramps or spasms get more severe or happen more often.  · Your cramps or spasms do not improve over time.  This information is not intended to replace advice given to you by your health care provider. Make sure you discuss any questions you have with your health care provider.  Document Released: 01/26/2002 Document Revised: 09/07/2015 Document Reviewed: 05/10/2015  Elsevier Interactive Patient Education © 2017 Elsevier Inc.

## 2016-10-24 NOTE — Telephone Encounter (Signed)
Insurance company would like to speak peer to peer to get CT of chest approved.Phone [phone].Member # [id number]

## 2016-10-24 NOTE — Progress Notes (Signed)
       Patient: Valerie West Female    DOB: [birth year]   31 y.o.   MRN: [id number] Visit Date: 10/24/2016  Today's Provider: Mar Daring, PA-C   Chief Complaint  Patient presents with  . Back Pain   Subjective:    HPI Back Pain: Patient presents for presents evaluation of  back problems.  Symptoms have been present for 2 weeks and include pain in upper left side (stabbing pain that comes and goes and for the last three days it has been consecutive in character; 9/10 in severity).Symptoms are worst: none. Alleviating factors identifiable by patient are none. Exacerbating factors identifiable by patient are none. Treatments so far initiated by patient: Advil. Patient reports no injury.    No Known Allergies   Current Outpatient Prescriptions:  .  ALPRAZolam (XANAX) 0.25 MG tablet, TAKE ONE TABLET DAILY BY MOUTH AS NEEDED FOR ANXIETY, Disp: 30 tablet, Rfl: 5 .  escitalopram (LEXAPRO) 20 MG tablet, TAKE 1 TABLET BY MOUTH EVERY DAY, Disp: 90 tablet, Rfl: 3 .  Melatonin-Pyridoxine ([employer] MELATONIN) 5-10 MG TBCR, Take by mouth., Disp: , Rfl:  .  rizatriptan (MAXALT) 10 MG tablet, , Disp: , Rfl:  .  TRI-PREVIFEM 0.18/0.215/0.25 MG-35 MCG tablet, Take 1 tablet by mouth daily., Disp: , Rfl: 11 .  vitamin B-12 (CYANOCOBALAMIN) 1000 MCG tablet, Take 1,000 mcg by mouth daily., Disp: , Rfl:   Review of Systems  Constitutional: Negative.   Respiratory: Negative.   Cardiovascular: Negative.   Gastrointestinal: Negative.   Musculoskeletal: Positive for back pain and myalgias.  Neurological: Negative.     Social History  Substance Use Topics  . Smoking status: Never Smoker  . Smokeless tobacco: Never Used  . Alcohol use 0.0 oz/week     Comment: occassional   Objective:   BP 90/60 (BP Location: Right Arm, Patient Position: Sitting, Cuff Size: Normal)   Pulse 73   Temp 98.2 F (36.8 C) (Oral)   Resp 16   Wt 140 lb 3.2 oz (63.6 kg)   BMI 24.84 kg/m     Physical Exam    Constitutional: She appears well-developed and well-nourished. No distress.  Neck: Normal range of motion. Neck supple.  Cardiovascular: Normal rate, regular rhythm and normal heart sounds.  Exam reveals no gallop and no friction rub.   No murmur heard. Pulmonary/Chest: Effort normal and breath sounds normal. No respiratory distress. She has no wheezes. She has no rales.  Musculoskeletal:       Arms: Skin: She is not diaphoretic.  Vitals reviewed.     Assessment & Plan:     1. Muscle strain Suspect muscle strain from unknown cause. Will treat with prednisone as below. Advised may use heating pad and epsom salt soaks. She is to call if symptoms worsne. - predniSONE (STERAPRED UNI-PAK 21 TAB) 10 MG (21) TBPK tablet; 6 day prednisone taper; take as directed on package instructions  Dispense: 21 tablet; Refill: 0  2. Lung nodule < 6cm on CT 58mm llung nodule incidentally noted in left lower lung on CT abd/pelvis in 06/2016. Was recommended for f/u with chest CT but this was never ordered. Will order low dose CT as below and f/u pending results.  - CT CHEST NODULE FOLLOW UP LOW DOSE W/O; Future       Mar Daring, PA-C  [facility]

## 2016-10-25 NOTE — Telephone Encounter (Signed)
Approval # [id number] Good for 30 days at [facility] 10/24/16-11/22/16

## 2016-10-26 NOTE — Telephone Encounter (Signed)
Patient called to check on status of CT scan appointment. Please call patient to let her know status on this. Thank you-aa

## 2016-10-30 ENCOUNTER — Telehealth: Payer: Self-pay | Admitting: Physician Assistant

## 2016-10-30 DIAGNOSIS — R911 Solitary pulmonary nodule: Secondary | ICD-10-CM

## 2016-10-30 NOTE — Telephone Encounter (Signed)
Order changed.

## 2016-10-30 NOTE — Telephone Encounter (Signed)
Per CT department when following up on lung nodule CT of chest w/o contrast should be test ordered (not low dose) Can this be corrected in [facility]

## 2016-10-31 ENCOUNTER — Ambulatory Visit
Admission: RE | Admit: 2016-10-31 | Discharge: 2016-10-31 | Disposition: A | Discharge status: Home / Self Care | Payer: BC Managed Care – PPO | Source: Ambulatory Visit | Attending: Physician Assistant | Admitting: Physician Assistant

## 2016-10-31 DIAGNOSIS — R911 Solitary pulmonary nodule: Secondary | ICD-10-CM | POA: Diagnosis not present

## 2016-11-01 ENCOUNTER — Other Ambulatory Visit: Payer: Self-pay | Admitting: Physician Assistant

## 2016-11-01 ENCOUNTER — Telehealth: Payer: Self-pay | Admitting: Physician Assistant

## 2016-11-01 DIAGNOSIS — R911 Solitary pulmonary nodule: Secondary | ICD-10-CM

## 2016-11-01 NOTE — Progress Notes (Signed)
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

## 2016-11-01 NOTE — Telephone Encounter (Signed)
Patient was wondering what Tawanna Sat thought about her appointment in [place] being on 4/4. She didn't know if she needed to try [facility] or [facility] to see if they could see her sooner than 4/4. Discussed Tawanna Sat and she states appointment date will be fine. Patient advised.

## 2016-11-01 NOTE — Telephone Encounter (Signed)
Pt talked to Sonia Baller about a pulmonary referral.  She wants to talk about going to some one at [facility] or [facility].  Please call pt back at [phone]  [facility]

## 2016-11-01 NOTE — Telephone Encounter (Signed)
Can you guys call to see who she wants to see and send to Sarah. Referral was placed earlier so she may be working on this already.

## 2016-11-12 ENCOUNTER — Other Ambulatory Visit: Payer: Self-pay | Admitting: Physician Assistant

## 2016-11-13 NOTE — Telephone Encounter (Signed)
Prescription called in to [place]. Spoke with pharmacist.  Thanks,  -Joseline

## 2016-11-21 ENCOUNTER — Institutional Professional Consult (permissible substitution): Payer: BC Managed Care – PPO | Admitting: Pulmonary Disease

## 2016-11-26 ENCOUNTER — Encounter: Payer: Self-pay | Admitting: Physician Assistant

## 2016-12-07 HISTORY — PX: LOBECTOMY: SHX5089

## 2016-12-14 ENCOUNTER — Other Ambulatory Visit: Payer: Self-pay | Admitting: Physician Assistant

## 2016-12-14 DIAGNOSIS — F32A Depression, unspecified: Secondary | ICD-10-CM

## 2016-12-14 DIAGNOSIS — F329 Major depressive disorder, single episode, unspecified: Secondary | ICD-10-CM

## 2016-12-18 ENCOUNTER — Telehealth: Payer: Self-pay | Admitting: Physician Assistant

## 2016-12-18 NOTE — Telephone Encounter (Signed)
Pt states she was discharged from [facility] 12/12/16 due to having surgery/lung lobectomy.  Pt wanted to let Tawanna Sat know she is doing good and is asking if Tawanna Sat will want to see her?  FM#[phone]/TS

## 2016-12-18 NOTE — Telephone Encounter (Signed)
Please Review.  Thanks,  -Joseline 

## 2016-12-19 NOTE — Telephone Encounter (Signed)
Patient advised as directed below. She reports she is doing well and she is to follow-up 12/25/16 with the Surgeon. She states that for now she will not schedule any appointment since she is following up with [facility].  Thanks,  -Joseline

## 2016-12-19 NOTE — Telephone Encounter (Signed)
That is perfect Thanks 

## 2016-12-19 NOTE — Telephone Encounter (Signed)
I don't have to at this time if she is doing well, but if she just wants to do a hospital f/u appt we can do that just to make sure medical record is updated.   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

## 2016-12-29 ENCOUNTER — Encounter: Payer: Self-pay | Admitting: Physician Assistant

## 2016-12-29 ENCOUNTER — Ambulatory Visit (INDEPENDENT_AMBULATORY_CARE_PROVIDER_SITE_OTHER): Payer: BC Managed Care – PPO | Admitting: Physician Assistant

## 2016-12-29 VITALS — BP 98/62 | HR 90 | Temp 98.1°F | Resp 16

## 2016-12-29 DIAGNOSIS — N3001 Acute cystitis with hematuria: Secondary | ICD-10-CM

## 2016-12-29 DIAGNOSIS — Z902 Acquired absence of lung [part of]: Secondary | ICD-10-CM | POA: Diagnosis not present

## 2016-12-29 DIAGNOSIS — T3695XA Adverse effect of unspecified systemic antibiotic, initial encounter: Secondary | ICD-10-CM

## 2016-12-29 DIAGNOSIS — B379 Candidiasis, unspecified: Secondary | ICD-10-CM | POA: Diagnosis not present

## 2016-12-29 LAB — POCT URINALYSIS DIPSTICK
BILIRUBIN UA: NEGATIVE
GLUCOSE UA: NEGATIVE
KETONES UA: NEGATIVE
Nitrite, UA: NEGATIVE
Protein, UA: NEGATIVE
SPEC GRAV UA: 1.01 (ref 1.010–1.025)
Urobilinogen, UA: 0.2 E.U./dL
pH, UA: 6.5 (ref 5.0–8.0)

## 2016-12-29 MED ORDER — OXYCODONE HCL 10 MG PO TABS: 5.0000 mg | ORAL_TABLET | Freq: Three times a day (TID) | ORAL | 0 refills | Status: DC

## 2016-12-29 MED ORDER — FLUCONAZOLE 150 MG PO TABS: 150.0000 mg | ORAL_TABLET | Freq: Once | ORAL | 0 refills | Status: AC

## 2016-12-29 MED ORDER — SULFAMETHOXAZOLE-TRIMETHOPRIM 800-160 MG PO TABS: 1.0000 | ORAL_TABLET | Freq: Two times a day (BID) | ORAL | 0 refills | Status: DC

## 2016-12-29 NOTE — Patient Instructions (Addendum)
Take 1/2 tab PO four times daily x 3 days, then 1/2 tab 3 times daily x 3 days, then 1/2 tab 2 times daily x 3 days, then 1/2 tab daily x 3 days  Urinary Tract Infection, Adult A urinary tract infection (UTI) is an infection of any part of the urinary tract, which includes the kidneys, ureters, bladder, and urethra. These organs make, store, and get rid of urine in the body. UTI can be a bladder infection (cystitis) or kidney infection (pyelonephritis). What are the causes? This infection may be caused by fungi, viruses, or bacteria. Bacteria are the most common cause of UTIs. This condition can also be caused by repeated incomplete emptying of the bladder during urination. What increases the risk? This condition is more likely to develop if:  You ignore your need to urinate or hold urine for long periods of time.  You do not empty your bladder completely during urination.  You wipe back to front after urinating or having a bowel movement, if you are female.  You are uncircumcised, if you are female.  You are constipated.  You have a urinary catheter that stays in place (indwelling).  You have a weak defense (immune) system.  You have a medical condition that affects your bowels, kidneys, or bladder.  You have diabetes.  You take antibiotic medicines frequently or for long periods of time, and the antibiotics no longer work well against certain types of infections (antibiotic resistance).  You take medicines that irritate your urinary tract.  You are exposed to chemicals that irritate your urinary tract.  You are female. What are the signs or symptoms? Symptoms of this condition include:  Fever.  Frequent urination or passing small amounts of urine frequently.  Needing to urinate urgently.  Pain or burning with urination.  Urine that smells bad or unusual.  Cloudy urine.  Pain in the lower abdomen or back.  Trouble urinating.  Blood in the urine.  Vomiting or  being less hungry than normal.  Diarrhea or abdominal pain.  Vaginal discharge, if you are female. How is this diagnosed? This condition is diagnosed with a medical history and physical exam. You will also need to provide a urine sample to test your urine. Other tests may be done, including:  Blood tests.  Sexually transmitted disease (STD) testing. If you have had more than one UTI, a cystoscopy or imaging studies may be done to determine the cause of the infections. How is this treated? Treatment for this condition often includes a combination of two or more of the following:  Antibiotic medicine.  Other medicines to treat less common causes of UTI.  Over-the-counter medicines to treat pain.  Drinking enough water to stay hydrated. Follow these instructions at home:  Take over-the-counter and prescription medicines only as told by your health care provider.  If you were prescribed an antibiotic, take it as told by your health care provider. Do not stop taking the antibiotic even if you start to feel better.  Avoid alcohol, caffeine, tea, and carbonated beverages. They can irritate your bladder.  Drink enough fluid to keep your urine clear or pale yellow.  Keep all follow-up visits as told by your health care provider. This is important.  Make sure to:  Empty your bladder often and completely. Do not hold urine for long periods of time.  Empty your bladder before and after sex.  Wipe from front to back after a bowel movement if you are female. Use each tissue  one time when you wipe. Contact a health care provider if:  You have back pain.  You have a fever.  You feel nauseous or vomit.  Your symptoms do not get better after 3 days.  Your symptoms go away and then return. Get help right away if:  You have severe back pain or lower abdominal pain.  You are vomiting and cannot keep down any medicines or water. This information is not intended to replace advice  given to you by your health care provider. Make sure you discuss any questions you have with your health care provider. Document Released: 05/16/2005 Document Revised: 01/18/2016 Document Reviewed: 06/27/2015 Elsevier Interactive Patient Education  2017 [employer].

## 2016-12-29 NOTE — Progress Notes (Signed)
Patient: Valerie West Female    DOB: [birth year]   31 y.o.   MRN: [id number] Visit Date: 12/29/2016  Today's Provider: Mar Daring, PA-C   Chief Complaint  Patient presents with  . Urinary Tract Infection   Subjective:    Urinary Tract Infection   This is a new problem. The current episode started in the past 7 days (about 2 days). The problem has been gradually worsening. The quality of the pain is described as aching and burning. There has been no fever (has felt feverish). Associated symptoms include chills, frequency and urgency. Pertinent negatives include no flank pain or hematuria. She has tried nothing for the symptoms. Her past medical history is significant for catheterization. 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 She is followed by [facility]    No Known Allergies   Current Outpatient Prescriptions:  .  ALPRAZolam (XANAX) 0.25 MG tablet, TAKE ONE TABLET DAILY BY MOUTH AS NEEDED FOR ANXIETY, Disp: 30 tablet, Rfl: 5 .  docusate sodium (COLACE) 100 MG capsule, Take 100 mg by mouth 2 (two) times daily., Disp: , Rfl:  .  escitalopram (LEXAPRO) 20 MG tablet, TAKE 1 TABLET BY MOUTH EVERY DAY, Disp: 90 tablet, Rfl: 3 .  Melatonin-Pyridoxine ([facility] MELATONIN) 5-10 MG TBCR, Take by mouth., Disp: , Rfl:  .  Oxycodone HCl 10 MG TABS, Take by mouth., Disp: , Rfl:  .  rizatriptan (MAXALT) 10 MG tablet, , Disp: , Rfl:  .  TRI-PREVIFEM 0.18/0.215/0.25 MG-35 MCG tablet, Take 1 tablet by mouth daily., Disp: , Rfl: 11 .  vitamin B-12 (CYANOCOBALAMIN) 1000 MCG tablet, Take 1,000 mcg by mouth daily., Disp: , Rfl:   Review of Systems  Constitutional: Positive for chills and fatigue.  Respiratory: Negative for cough, chest tightness, shortness of breath and wheezing.   Cardiovascular: Positive for chest pain (improving post op pain). Negative for palpitations and leg swelling.  Gastrointestinal: Negative.   Genitourinary: Positive  for dysuria, frequency and urgency. Negative for flank pain and hematuria.  Musculoskeletal: Negative for back pain.  Neurological: Negative for dizziness and headaches.    Social History  Substance Use Topics  . Smoking status: Never Smoker  . Smokeless tobacco: Never Used  . Alcohol use 0.0 oz/week     Comment: occassional   Objective:   BP 98/62 (BP Location: Right Arm, Patient Position: Sitting, Cuff Size: Normal)   Pulse 90   Temp 98.1 F (36.7 C)   Resp 16   SpO2 99%  Vitals:   12/29/16 1001  BP: 98/62  Pulse: 90  Resp: 16  Temp: 98.1 F (36.7 C)  SpO2: 99%     Physical Exam  Constitutional: She is oriented to person, place, and time. She appears well-developed and well-nourished. No distress.  Cardiovascular: Normal rate, regular rhythm and normal heart sounds.  Exam reveals no gallop and no friction rub.   No murmur heard. Pulmonary/Chest: Effort normal and breath sounds normal. No respiratory distress. She has no wheezes. She has no rales.  Abdominal: Soft. Normal appearance and bowel sounds are normal. She exhibits no distension and no mass. There is no hepatosplenomegaly. There is tenderness in the suprapubic area. There is no rebound, no guarding and no CVA tenderness.  Neurological: She is alert and oriented to person, place, and time.  Skin: Skin is warm and dry. She is not diaphoretic.  Vitals reviewed.      Assessment &  Plan:     1. Acute cystitis with hematuria Urine only shows small amount of leukocytes. Will treat as below with Bactrim. Urine sent for culture and will adjust treatment as needed per C&S results. Push fluids. - POCT urinalysis dipstick - Urine culture - sulfamethoxazole-trimethoprim (BACTRIM DS,SEPTRA DS) 800-160 MG tablet; Take 1 tablet by mouth 2 (two) times daily.  Dispense: 14 tablet; Refill: 0  2. S/P lobectomy of lung Tapering dose given as below and instructions to taper given to patient via AVS.  - Oxycodone HCl 10 MG TABS;  Take 0.5 tablets (5 mg total) by mouth 3 (three) times daily.  Dispense: 30 tablet; Refill: 0  3. Antibiotic-induced yeast infection Gets yeast infections with antibiotics. Given diflucan to use when and if needed. - fluconazole (DIFLUCAN) 150 MG tablet; Take 1 tablet (150 mg total) by mouth once. May repeat in 48-72 hrs prn  Dispense: 3 tablet; Refill: 0       Mar Daring, PA-C  [facility]

## 2016-12-31 ENCOUNTER — Telehealth: Payer: Self-pay

## 2016-12-31 LAB — URINE CULTURE

## 2016-12-31 NOTE — Telephone Encounter (Signed)
Patient advised as directed below.  Thanks,  -Joseline 

## 2016-12-31 NOTE — Telephone Encounter (Signed)
-----   Message from Mar Daring, PA-C sent at 12/31/2016  8:17 AM EDT ----- Urine culture did not show any bacterial growth. May complete antibiotic if symptoms are improving.

## 2017-06-17 ENCOUNTER — Ambulatory Visit (INDEPENDENT_AMBULATORY_CARE_PROVIDER_SITE_OTHER): Payer: BC Managed Care – PPO | Admitting: Physician Assistant

## 2017-06-17 ENCOUNTER — Telehealth: Payer: Self-pay

## 2017-06-17 ENCOUNTER — Encounter: Payer: Self-pay | Admitting: Physician Assistant

## 2017-06-17 VITALS — BP 100/60 | HR 86 | Temp 98.2°F | Resp 16 | Ht 63.0 in | Wt 138.4 lb

## 2017-06-17 DIAGNOSIS — D3A090 Benign carcinoid tumor of the bronchus and lung: Secondary | ICD-10-CM | POA: Insufficient documentation

## 2017-06-17 DIAGNOSIS — R5383 Other fatigue: Secondary | ICD-10-CM

## 2017-06-17 DIAGNOSIS — F411 Generalized anxiety disorder: Secondary | ICD-10-CM | POA: Diagnosis not present

## 2017-06-17 DIAGNOSIS — Z0000 Encounter for general adult medical examination without abnormal findings: Secondary | ICD-10-CM

## 2017-06-17 DIAGNOSIS — Z23 Encounter for immunization: Secondary | ICD-10-CM

## 2017-06-17 DIAGNOSIS — Z902 Acquired absence of lung [part of]: Secondary | ICD-10-CM

## 2017-06-17 DIAGNOSIS — F53 Postpartum depression: Secondary | ICD-10-CM | POA: Diagnosis not present

## 2017-06-17 DIAGNOSIS — O99345 Other mental disorders complicating the puerperium: Secondary | ICD-10-CM

## 2017-06-17 DIAGNOSIS — G43109 Migraine with aura, not intractable, without status migrainosus: Secondary | ICD-10-CM | POA: Diagnosis not present

## 2017-06-17 DIAGNOSIS — E538 Deficiency of other specified B group vitamins: Secondary | ICD-10-CM

## 2017-06-17 DIAGNOSIS — Z9049 Acquired absence of other specified parts of digestive tract: Secondary | ICD-10-CM | POA: Insufficient documentation

## 2017-06-17 MED ORDER — ALPRAZOLAM 0.25 MG PO TABS: ORAL_TABLET | ORAL | 1 refills | Status: DC

## 2017-06-17 MED ORDER — ESCITALOPRAM OXALATE 20 MG PO TABS: 20.0000 mg | ORAL_TABLET | Freq: Every day | ORAL | 3 refills | Status: DC

## 2017-06-17 NOTE — Progress Notes (Signed)
Patient: Valerie West, Female    DOB: [birth year], 31 y.o.   MRN: [id number] Visit Date: 06/17/2017  Today's Provider: Mar Daring, PA-C   Chief Complaint  Patient presents with  . Annual Exam   Subjective:    Annual physical exam Valerie West is a 31 y.o. female who presents today for health maintenance and complete physical. She feels fairly well. She reports exercising some. She reports she is sleeping fairly well. ----------------------------------------------------------------- She sees Pulmonary tomorrow for SOB, referred by her Oncologist.  Oncology next week and is scheduled for CT for f/u. Had CXR and was normal.  She is scheduled to see GYN December 2018 for pap and breast exam.  Review of Systems  Constitutional: Positive for fatigue.  HENT: Positive for sore throat.   Eyes: Negative.   Respiratory: Positive for cough, chest tightness and shortness of breath.   Cardiovascular: Positive for chest pain and palpitations.  Gastrointestinal: Negative.   Endocrine: Negative.   Genitourinary: Negative.   Musculoskeletal: Positive for back pain and neck pain.  Skin: Negative.   Allergic/Immunologic: Negative.   Neurological: Positive for headaches.  Hematological: Negative.   Psychiatric/Behavioral: The patient is nervous/anxious.     Social History      She  reports that she has never smoked. She has never used smokeless tobacco. She reports that she drinks alcohol. She reports that she does not use drugs.       Social History   Social History  . Marital status: Married    Spouse name: Louie Casa  . Number of children: 2  . Years of education: 63   Occupational History  . teacher [employer]   Social History Main Topics  . Smoking status: Never Smoker  . Smokeless tobacco: Never Used  . Alcohol use 0.0 oz/week     Comment: occassional  . Drug use: No  . Sexual activity: Not Asked   Other Topics Concern  . None   Social History Narrative  .  None    Past Medical History:  Diagnosis Date  . Anxiety   . Depression during pregnancy, antepartum unk     Patient Active Problem List   Diagnosis Date Noted  . Right lower quadrant abdominal pain   . Vitamin B12 deficiency 03/15/2015  . Migraine 02/14/2015  . Anxiety 01/27/2015  . Myofascial pain 01/27/2015  . Cervical muscle strain 01/27/2015  . Depression, postpartum 10/26/2013    Past Surgical History:  Procedure Laterality Date  . LAPAROSCOPIC APPENDECTOMY N/A 06/23/2016   Procedure: APPENDECTOMY LAPAROSCOPIC;  Surgeon: Clayburn Pert, MD;  Location: [facility] ORS;  Service: General;  Laterality: N/A;  . NO PAST SURGERIES      Family History        Family Status  Relation Status  . Father Alive  . MGM Alive  . MGF Alive  . Mother Alive        Her family history includes Alcohol abuse in her father; Bipolar disorder in her father; Cancer in her maternal grandfather and maternal grandmother; Depression in her father; Diabetes in her maternal grandfather and maternal grandmother; Drug abuse in her father; Hypertension in her mother.     No Known Allergies   Current Outpatient Prescriptions:  .  ALPRAZolam (XANAX) 0.25 MG tablet, TAKE ONE TABLET DAILY BY MOUTH AS NEEDED FOR ANXIETY, Disp: 30 tablet, Rfl: 5 .  docusate sodium (COLACE) 100 MG capsule, Take 100 mg by mouth 2 (two) times daily., Disp: ,  Rfl:  .  escitalopram (LEXAPRO) 20 MG tablet, TAKE 1 TABLET BY MOUTH EVERY DAY, Disp: 90 tablet, Rfl: 3 .  Melatonin-Pyridoxine ([facility] MELATONIN) 5-10 MG TBCR, Take by mouth., Disp: , Rfl:  .  Oxycodone HCl 10 MG TABS, Take 0.5 tablets (5 mg total) by mouth 3 (three) times daily., Disp: 30 tablet, Rfl: 0 .  rizatriptan (MAXALT) 10 MG tablet, , Disp: , Rfl:  .  sulfamethoxazole-trimethoprim (BACTRIM DS,SEPTRA DS) 800-160 MG tablet, Take 1 tablet by mouth 2 (two) times daily., Disp: 14 tablet, Rfl: 0 .  TRI-PREVIFEM 0.18/0.215/0.25 MG-35 MCG tablet, Take 1 tablet by mouth  daily., Disp: , Rfl: 11 .  vitamin B-12 (CYANOCOBALAMIN) 1000 MCG tablet, Take 1,000 mcg by mouth daily., Disp: , Rfl:    Patient Care Team: Rubye Beach as PCP - General (Physician Assistant)      Objective:   Vitals: BP 100/60 (BP Location: Left Arm, Patient Position: Sitting, Cuff Size: Normal)   Pulse 86   Temp 98.2 F (36.8 C) (Oral)   Resp 16   Ht 5\' 3"  (1.6 m)   Wt 138 lb 6.4 oz (62.8 kg)   BMI 24.52 kg/m     Physical Exam  Constitutional: She is oriented to person, place, and time. She appears well-developed and well-nourished. No distress.  HENT:  Head: Normocephalic and atraumatic.  Right Ear: Hearing, tympanic membrane, external ear and ear canal normal.  Left Ear: Hearing, tympanic membrane, external ear and ear canal normal.  Nose: Nose normal.  Mouth/Throat: Uvula is midline, oropharynx is clear and moist and mucous membranes are normal. No oropharyngeal exudate.  Eyes: Pupils are equal, round, and reactive to light. Conjunctivae and EOM are normal. Right eye exhibits no discharge. Left eye exhibits no discharge. No scleral icterus.  Neck: Normal range of motion. Neck supple. No JVD present. No tracheal deviation present. No thyromegaly present.  Cardiovascular: Normal rate, regular rhythm, normal heart sounds and intact distal pulses.  Exam reveals no gallop and no friction rub.   No murmur heard. Pulmonary/Chest: Effort normal and breath sounds normal. No respiratory distress. She has no wheezes. She has no rales. She exhibits no tenderness.  Abdominal: Soft. Bowel sounds are normal. She exhibits no distension and no mass. There is no tenderness. There is no rebound and no guarding.  Genitourinary:  Genitourinary Comments: Deferred to GYN, has appt in December  Musculoskeletal: Normal range of motion. She exhibits no edema or tenderness.  Lymphadenopathy:    She has no cervical adenopathy.  Neurological: She is alert and oriented to person, place,  and time. She has normal reflexes.  Skin: Skin is warm and dry. No rash noted. She is not diaphoretic.  Psychiatric: She has a normal mood and affect. Her behavior is normal. Judgment and thought content normal.  Vitals reviewed.   Depression Screen PHQ 2/9 Scores 06/17/2017  PHQ - 2 Score 0  PHQ- 9 Score 7      Assessment & Plan:     Routine Health Maintenance and Physical Exam  Exercise Activities and Dietary recommendations Goals    None      Immunization History  Administered Date(s) Administered  . Influenza,inj,Quad PF,6+ Mos 06/15/2015    Health Maintenance  Topic Date Due  . HIV Screening  04/17/1999  . TETANUS/TDAP  04/17/2003  . INFLUENZA VACCINE  03/20/2017  . PAP SMEAR  09/20/2018     Discussed health benefits of physical activity, and encouraged her to engage in regular  exercise appropriate for her age and condition.    1. Annual physical exam Normal physical exam today. Will check labs as below and f/u pending lab results. If labs are stable and WNL she will not need to have these rechecked for one year at her next annual physical exam. She is to call the office in the meantime if she has any acute issue, questions or concerns. - CBC with Differential/Platelet - Comprehensive metabolic panel - Hemoglobin A1c - Lipid panel - TSH - B12 - Vitamin D (25 hydroxy)  2. Postpartum depression Stable. Diagnosis pulled for medication refill. Continue current medical treatment plan. - escitalopram (LEXAPRO) 20 MG tablet; Take 1 tablet (20 mg total) by mouth daily.  Dispense: 90 tablet; Refill: 3  3. GAD (generalized anxiety disorder) Stable. Diagnosis pulled for medication refill. Continue current medical treatment plan. - ALPRAZolam (XANAX) 0.25 MG tablet; TAKE ONE TABLET DAILY BY MOUTH AS NEEDED FOR ANXIETY  Dispense: 90 tablet; Refill: 1  4. Migraine with aura and without status migrainosus, not intractable Followed by Dr. Manuella Ghazi. Has found correlation  recently that they seem to be associated with her menstrual cycle. Has 1-2 per menstrual cycle at the beginning. She has been off her OCP since surgery (lobectomy) in 11/2016. She is going to discuss with her oncologist when she returns in the next coming weeks (06/25/17) following her repeat CT. If normal she may ask her GYN in December about restarting. She is going to research depo injection vs IUD as it may be beneficial to avoid estrogen.   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 Has f/u with 6 month Chest CT on 06/25/17 with [facility] surgical oncology.   6. S/P lobectomy of lung See above medical treatment plan.  7. Vitamin B12 deficiency H/O this and not on supplementation at this time. Will check labs as below and f/u pending results.  - B12  8. Fatigue, unspecified type Will check labs as below to r/o organic cause. - CBC with Differential/Platelet - Comprehensive metabolic panel - TSH - H70 - Vitamin D (25 hydroxy)  9. Need for influenza vaccination Flu vaccine given today without complication. Patient sat upright for 15 minutes to check for adverse reaction before being released. - Flu Vaccine QUAD 36+ mos IM  --------------------------------------------------------------------    Mar Daring, PA-C  [facility]

## 2017-06-17 NOTE — Patient Instructions (Signed)

## 2017-06-17 NOTE — Telephone Encounter (Signed)
Called prescription in for Alprazolam 0.25 mg tab Take 1 (one) tab PO daily PRN anxiety. [facility] R:1  Thanks,   -Ellenora Talton

## 2017-06-28 ENCOUNTER — Encounter: Payer: Self-pay | Admitting: Physician Assistant

## 2017-06-28 ENCOUNTER — Telehealth: Payer: Self-pay

## 2017-06-28 LAB — TSH: TSH: 2.12 m[IU]/L

## 2017-06-28 LAB — COMPLETE METABOLIC PANEL WITH GFR
AG RATIO: 2 (calc) (ref 1.0–2.5)
ALKALINE PHOSPHATASE (APISO): 50 U/L (ref 33–115)
ALT: 6 U/L (ref 6–29)
AST: 14 U/L (ref 10–30)
Albumin: 4.5 g/dL (ref 3.6–5.1)
BUN: 13 mg/dL (ref 7–25)
CALCIUM: 9 mg/dL (ref 8.6–10.2)
CO2: 27 mmol/L (ref 20–32)
Chloride: 102 mmol/L (ref 98–110)
Creat: 0.67 mg/dL (ref 0.50–1.10)
GFR, EST NON AFRICAN AMERICAN: 115 mL/min/{1.73_m2} (ref >=60)
GFR, Est African American: 134 mL/min/{1.73_m2} (ref >=60)
Globulin: 2.2 g/dL (calc) (ref 1.9–3.7)
Glucose, Bld: 87 mg/dL (ref 65–99)
Potassium: 4.3 mmol/L (ref 3.5–5.3)
Sodium: 136 mmol/L (ref 135–146)
Total Bilirubin: 0.7 mg/dL (ref 0.2–1.2)
Total Protein: 6.7 g/dL (ref 6.1–8.1)

## 2017-06-28 LAB — CBC WITH DIFFERENTIAL/PLATELET
BASOS ABS: 40 {cells}/uL (ref 0–200)
Basophils Relative: 1 %
EOS ABS: 132 {cells}/uL (ref 15–500)
Eosinophils Relative: 3.3 %
HEMATOCRIT: 39.7 % (ref 35.0–45.0)
Hemoglobin: 13.7 g/dL (ref 11.7–15.5)
LYMPHS ABS: 1420 {cells}/uL (ref 850–3900)
MCH: 32.5 pg (ref 27.0–33.0)
MCHC: 34.5 g/dL (ref 32.0–36.0)
MCV: 94.3 fL (ref 80.0–100.0)
MPV: 10.8 fL (ref 7.5–12.5)
Monocytes Relative: 8.5 %
Neutro Abs: 2068 cells/uL (ref 1500–7800)
Neutrophils Relative %: 51.7 %
Platelets: 255 10*3/uL (ref 140–400)
RBC: 4.21 10*6/uL (ref 3.80–5.10)
RDW: 11.8 % (ref 11.0–15.0)
Total Lymphocyte: 35.5 %
WBC: 4 10*3/uL (ref 3.8–10.8)
WBCMIX: 340 {cells}/uL (ref 200–950)

## 2017-06-28 LAB — HEMOGLOBIN A1C
EAG (MMOL/L): 5.2 (calc)
HEMOGLOBIN A1C: 4.9 %{Hb} (ref <5.7)
MEAN PLASMA GLUCOSE: 94 (calc)

## 2017-06-28 LAB — LIPID PANEL
CHOL/HDL RATIO: 1.7 (calc) (ref <5.0)
Cholesterol: 132 mg/dL (ref <200)
HDL: 80 mg/dL (ref >50)
LDL Cholesterol (Calc): 42 mg/dL (calc)
NON-HDL CHOLESTEROL (CALC): 52 mg/dL (ref <130)
Triglycerides: 36 mg/dL (ref <150)

## 2017-06-28 LAB — VITAMIN B12: Vitamin B-12: 273 pg/mL (ref 200–1100)

## 2017-06-28 LAB — VITAMIN D 25 HYDROXY (VIT D DEFICIENCY, FRACTURES): Vit D, 25-Hydroxy: 33 ng/mL (ref 30–100)

## 2017-06-28 NOTE — Telephone Encounter (Signed)
Patient advised as below.  

## 2017-06-28 NOTE — Telephone Encounter (Signed)
-----   Message from Mar Daring, PA-C sent at 06/28/2017  8:10 AM EST ----- All labs are within normal limits and stable.  Thanks! -JB

## 2017-08-08 ENCOUNTER — Encounter: Payer: Self-pay | Admitting: Physician Assistant

## 2017-08-08 ENCOUNTER — Ambulatory Visit: Payer: BC Managed Care – PPO | Admitting: Physician Assistant

## 2017-08-08 VITALS — BP 102/64 | HR 84 | Temp 98.5°F | Resp 16 | Wt 140.0 lb

## 2017-08-08 DIAGNOSIS — J0110 Acute frontal sinusitis, unspecified: Secondary | ICD-10-CM

## 2017-08-08 MED ORDER — AMOXICILLIN-POT CLAVULANATE 875-125 MG PO TABS: 1.0000 | ORAL_TABLET | Freq: Two times a day (BID) | ORAL | 0 refills | Status: AC

## 2017-08-08 NOTE — Progress Notes (Signed)
[facility]  Chief Complaint  Patient presents with  . URI    Started about three weeks ago  . Sinusitis    Started about a week ago    Subjective:    Patient ID: Valerie West, female    DOB: 05/20/1986, 31 y.o.   MRN: [id number]  Upper Respiratory Infection: Valerie West is a 31 y.o. female with a past medical history significant for left lower lobectomy 2/2 carcinoid tumor  complaining of symptoms of a URI, possible sinusitis. Symptoms include congestion, cough and sore throat. Onset of symptoms was 3 weeks ago, gradually worsening since that time. She also c/o congestion, cough described as productive, nasal congestion, post nasal drip and sinus pressure for the past 1 week .  She is drinking plenty of fluids. Evaluation to date: none. Treatment to date: cough suppressants and decongestants. The treatment has provided minimal relief.   Review of Systems  Constitutional: Positive for fatigue. Negative for activity change, appetite change, chills, diaphoresis, fever and unexpected weight change.  HENT: Positive for congestion, nosebleeds, postnasal drip, rhinorrhea, sinus pressure, sinus pain and sore throat. Negative for dental problem, drooling, ear discharge, ear pain, mouth sores, sneezing, tinnitus, trouble swallowing and voice change.   Eyes: Negative.   Respiratory: Positive for cough, chest tightness, shortness of breath and wheezing. Negative for apnea, choking and stridor.   Neurological: Negative for dizziness, light-headedness and headaches.       Objective:   BP 102/64 (BP Location: Right Arm, Patient Position: Sitting, Cuff Size: Normal)   Pulse 84   Temp 98.5 F (36.9 C) (Oral)   Resp 16   Wt 140 lb (63.5 kg)   LMP 08/05/2017   SpO2 98%   BMI 24.80 kg/m   Patient Active Problem List   Diagnosis Date Noted  . 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 S/P laparoscopic appendectomy 06/17/2017  . S/P lobectomy of lung  06/17/2017  . Vitamin B12 deficiency 03/15/2015  . Migraine 02/14/2015  . Anxiety 01/27/2015  . Myofascial pain 01/27/2015  . Cervical muscle strain 01/27/2015  . Depression, postpartum 10/26/2013    Outpatient Encounter Medications as of 08/08/2017  Medication Sig Note  . ALPRAZolam (XANAX) 0.25 MG tablet TAKE ONE TABLET DAILY BY MOUTH AS NEEDED FOR ANXIETY   . escitalopram (LEXAPRO) 20 MG tablet Take 1 tablet (20 mg total) by mouth daily.   . Melatonin-Pyridoxine ([facility] MELATONIN) 5-10 MG TBCR Take by mouth.   . rizatriptan (MAXALT) 10 MG tablet  06/14/2016: Received from: External Pharmacy  . vitamin B-12 (CYANOCOBALAMIN) 1000 MCG tablet Take 1,000 mcg by mouth daily.   Marland Kitchen docusate sodium (COLACE) 100 MG capsule Take 100 mg by mouth 2 (two) times daily.   Marland Kitchen sulfamethoxazole-trimethoprim (BACTRIM DS,SEPTRA DS) 800-160 MG tablet Take 1 tablet by mouth 2 (two) times daily.   . TRI-PREVIFEM 0.18/0.215/0.25 MG-35 MCG tablet Take 1 tablet by mouth daily. 01/27/2015: Received from: External Pharmacy   No facility-administered encounter medications on file as of 08/08/2017.     No Known Allergies     Physical Exam  Constitutional: She is oriented to person, place, and time. She appears well-developed and well-nourished. No distress.  HENT:  Right Ear: External ear normal.  Left Ear: External ear normal.  Nose: Right sinus exhibits maxillary sinus tenderness and frontal sinus tenderness. Left sinus exhibits maxillary sinus tenderness and frontal sinus tenderness.  Mouth/Throat: Oropharynx is clear and moist. No oropharyngeal exudate, posterior oropharyngeal edema  or posterior oropharyngeal erythema.  Tms opaque bilaterally   Eyes: Conjunctivae are normal. Right eye exhibits no discharge. Left eye exhibits no discharge.  Neck: Neck supple.  Cardiovascular: Normal rate and regular rhythm.  Pulmonary/Chest: Effort normal and breath sounds normal. No respiratory distress. She has no wheezes.  She has no rales.  Lymphadenopathy:    She has cervical adenopathy.  Neurological: She is alert and oriented to person, place, and time.  Skin: Skin is warm and dry. She is not diaphoretic.  Psychiatric: She has a normal mood and affect. Her behavior is normal.       Assessment & Plan:  1. Acute non-recurrent frontal sinusitis  - amoxicillin-clavulanate (AUGMENTIN) 875-125 MG tablet; Take 1 tablet by mouth 2 (two) times daily for 10 days.  Dispense: 20 tablet; Refill: 0  Return if symptoms worsen or fail to improve.   The entirety of the information documented in the History of Present Illness, Review of Systems and Physical Exam were personally obtained by me. Portions of this information were initially documented by Ashley Royalty, CMA and reviewed by me for thoroughness and accuracy.

## 2017-08-08 NOTE — Patient Instructions (Signed)

## 2017-10-11 ENCOUNTER — Ambulatory Visit: Payer: BC Managed Care – PPO | Admitting: Physician Assistant

## 2017-10-11 ENCOUNTER — Encounter: Payer: Self-pay | Admitting: Physician Assistant

## 2017-10-11 VITALS — BP 104/70 | HR 79 | Temp 98.6°F | Resp 15 | Wt 140.6 lb

## 2017-10-11 DIAGNOSIS — H00011 Hordeolum externum right upper eyelid: Secondary | ICD-10-CM | POA: Diagnosis not present

## 2017-10-11 NOTE — Patient Instructions (Signed)

## 2017-10-11 NOTE — Progress Notes (Signed)
Patient: Valerie West Female    DOB: 04-Jun-1986   32 y.o.   MRN: [id number] Visit Date: 10/11/2017  Today's Provider: Trinna Post, PA-C   Chief Complaint  Patient presents with  . Belepharitis   Subjective:    Valerie West is a 32 y/o woman presenting with right eye pain x 4 days. Reports swelling and bumps on eyes on Monday that have since dissipated, but eye pain remains. She had some blurred vision in her right eye when swelling was at its worst but this has resolved as swelling has gone down. She denies discharge, eye injuries, redness. 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 She does wear eye makeup. She has been using hot compresses and ibuprofen 400 mg nightly with moderate relief.  Eye Problem   The right eye is affected. This is a new problem. The current episode started in the past 7 days. The problem has been unchanged. There was no injury mechanism. The pain is at a severity of 3/10. The pain is mild. There is no known exposure to pink eye. She does not wear contacts. Associated symptoms include blurred vision. Pertinent negatives include no eye discharge, double vision, eye redness, fever, foreign body sensation, itching, nausea, photophobia, recent URI or vomiting.       No Known Allergies   Current Outpatient Medications:  .  ALPRAZolam (XANAX) 0.25 MG tablet, TAKE ONE TABLET DAILY BY MOUTH AS NEEDED FOR ANXIETY, Disp: 90 tablet, Rfl: 1 .  escitalopram (LEXAPRO) 20 MG tablet, Take 1 tablet (20 mg total) by mouth daily., Disp: 90 tablet, Rfl: 3 .  Melatonin-Pyridoxine ([facility] MELATONIN) 5-10 MG TBCR, Take by mouth., Disp: , Rfl:  .  Multiple Vitamin (MULTIVITAMIN) capsule, Take by mouth., Disp: , Rfl:  .  rizatriptan (MAXALT) 10 MG tablet, , Disp: , Rfl:   Review of Systems  Constitutional: Negative for fever.  HENT: Negative.   Eyes: Positive for blurred vision. Negative for double vision, photophobia, discharge, redness and  itching.  Respiratory: Negative.   Cardiovascular: Negative.   Gastrointestinal: Negative.  Negative for nausea and vomiting.  Endocrine: Negative.   Genitourinary: Negative.   Musculoskeletal: Negative.   Allergic/Immunologic: Negative.   Neurological: Negative.   Hematological: Negative.   Psychiatric/Behavioral: Negative.     Social History   Tobacco Use  . Smoking status: Never Smoker  . Smokeless tobacco: Never Used  Substance Use Topics  . Alcohol use: Yes    Alcohol/week: 0.0 oz    Comment: occassional   Objective:   BP 104/70   Pulse 79   Temp 98.6 F (37 C) (Oral)   Resp 15   Wt 140 lb 9.6 oz (63.8 kg)   LMP 09/30/2017 (Exact Date)   SpO2 99%   BMI 24.91 kg/m  Vitals:   10/11/17 0839  BP: 104/70  Pulse: 79  Resp: 15  Temp: 98.6 F (37 C)  TempSrc: Oral  SpO2: 99%  Weight: 140 lb 9.6 oz (63.8 kg)     Physical Exam  Constitutional: She appears well-developed and well-nourished.  HENT:  Right Ear: External ear normal.  Left Ear: External ear normal.  Mouth/Throat: Oropharynx is clear and moist.  Eyes: Conjunctivae and lids are normal. Pupils are equal, round, and reactive to light. Lids are everted and swept, no foreign bodies found. Right eye exhibits no chemosis, no discharge, no exudate and no hordeolum. No foreign body present in the right  eye. Left eye exhibits no chemosis, no discharge, no exudate and no hordeolum. No foreign body present in the left eye. No scleral icterus.        Assessment & Plan:     1. Hordeolum externum of right upper eyelid  Exam benign today but from history sounds like stye, possible blepharitis. Reassured that this is normally self limiting, she may continue hot compresses and take 400 mg ibuprofen TID for several days until resolved. Advised to avoid eye makeup during this and to change her makeup applicators.  Return if symptoms worsen or fail to improve.  The entirety of the information documented in the  History of Present Illness, Review of Systems and Physical Exam were personally obtained by me. Portions of this information were initially documented by Ashley Royalty, CMA and reviewed by me for thoroughness and accuracy.        Trinna Post, PA-C  [facility]

## 2017-11-22 ENCOUNTER — Encounter: Payer: Self-pay | Admitting: Physician Assistant

## 2017-11-22 ENCOUNTER — Ambulatory Visit
Admission: RE | Admit: 2017-11-22 | Discharge: 2017-11-22 | Disposition: A | Discharge status: Home / Self Care | Payer: BC Managed Care – PPO | Source: Ambulatory Visit | Attending: Physician Assistant | Admitting: Physician Assistant

## 2017-11-22 ENCOUNTER — Ambulatory Visit: Payer: BC Managed Care – PPO | Admitting: Physician Assistant

## 2017-11-22 VITALS — BP 104/62 | HR 76 | Temp 98.6°F | Resp 16 | Wt 139.0 lb

## 2017-11-22 DIAGNOSIS — R079 Chest pain, unspecified: Secondary | ICD-10-CM

## 2017-11-22 DIAGNOSIS — R9431 Abnormal electrocardiogram [ECG] [EKG]: Secondary | ICD-10-CM | POA: Diagnosis not present

## 2017-11-22 DIAGNOSIS — R0602 Shortness of breath: Secondary | ICD-10-CM | POA: Insufficient documentation

## 2017-11-22 NOTE — Progress Notes (Signed)
Patient: Valerie West Female    DOB: [birth year]   32 y.o.   MRN: [id number] Visit Date: 11/22/2017  Today's Provider: Mar Daring, PA-C   Chief Complaint  Patient presents with  . Chest Pain   Subjective:    HPI   Patient comes in today c/o chest pain that is located in the center. She reports that she woke up with this pain. She denies nausea, headache or numbness and tingling in her extremities. She reports that she feels the need to take a deep breath. Denies SOB, cough, wheeze. She also mentions that she felt a little dizzy this morning when she woke up.  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 She has had f/u CT scans that have been normal, most recently on 07/04/17.     No Known Allergies   Current Outpatient Medications:  .  ALPRAZolam (XANAX) 0.25 MG tablet, TAKE ONE TABLET DAILY BY MOUTH AS NEEDED FOR ANXIETY, Disp: 90 tablet, Rfl: 1 .  escitalopram (LEXAPRO) 20 MG tablet, Take 1 tablet (20 mg total) by mouth daily., Disp: 90 tablet, Rfl: 3 .  Melatonin-Pyridoxine ([employer] MELATONIN) 5-10 MG TBCR, Take by mouth., Disp: , Rfl:  .  Multiple Vitamin (MULTIVITAMIN) capsule, Take by mouth., Disp: , Rfl:  .  rizatriptan (MAXALT) 10 MG tablet, , Disp: , Rfl:   Review of Systems  Constitutional: Negative.   Respiratory: Negative for cough, chest tightness, shortness of breath (just feels like she needs to take a deep breath) and wheezing.   Cardiovascular: Positive for chest pain (tenderness over sternum). Negative for palpitations.  Gastrointestinal: Negative for abdominal pain.  Musculoskeletal: Positive for myalgias. Negative for arthralgias, back pain, gait problem and joint swelling.  Neurological: Positive for dizziness. Negative for tremors, syncope, weakness, light-headedness, numbness and headaches.    Social History   Tobacco Use  . Smoking status: Never Smoker  . Smokeless tobacco: Never Used  Substance Use  Topics  . Alcohol use: Yes    Alcohol/week: 0.0 oz    Comment: occassional   Objective:   BP 104/62 (BP Location: Right Arm, Patient Position: Sitting, Cuff Size: Normal)   Pulse 76   Temp 98.6 F (37 C)   Resp 16   Wt 139 lb (63 kg)   SpO2 98%   BMI 24.62 kg/m  Vitals:   11/22/17 1131  BP: 104/62  Pulse: 76  Resp: 16  Temp: 98.6 F (37 C)  SpO2: 98%  Weight: 139 lb (63 kg)     Physical Exam  Constitutional: She appears well-developed and well-nourished. No distress.  Neck: Normal range of motion. Neck supple. No JVD present. No tracheal deviation present. No thyromegaly present.  Cardiovascular: Normal rate, regular rhythm and normal heart sounds. Exam reveals no gallop and no friction rub.  No murmur heard. Pulmonary/Chest: Effort normal and breath sounds normal. No respiratory distress. She has no wheezes. She has no rales. She exhibits tenderness.    Lymphadenopathy:    She has no cervical adenopathy.  Skin: She is not diaphoretic.  Vitals reviewed.       Assessment & Plan:     1. Chest pain, unspecified type Suspect costochondritis. EKG today normal, short PR segment noted (PR 110) rate of 67. Patient only on lexapro that may incite. Will have her return in 3-6 months and repeat EKG. Will order CXR to R/O any other cause especially since patient has  h/o carcinoid tumor. At this time we will proceed with conservative treatment with NSAIDs, heating pad. Call if no improvements or symptoms worsen.  - EKG 12-Lead - DG Chest 2 View; Future  2. SOB (shortness of breath) See above medical treatment plan. - DG Chest 2 View; Future  3. Shortened PR interval Noted on EKG as above documented. Will recheck in 3-6 months and if still shortened, may consider stopping or changing lexapro.        Mar Daring, PA-C  [facility]

## 2017-11-22 NOTE — Patient Instructions (Signed)
Costochondritis Costochondritis is swelling and irritation (inflammation) of the tissue (cartilage) that connects your ribs to your breastbone (sternum). This causes pain in the front of your chest. The pain usually starts gradually and involves more than one rib. What are the causes? The exact cause of this condition is not always known. It results from stress on the cartilage where your ribs attach to your sternum. The cause of this stress could be:  Chest injury (trauma).  Exercise or activity, such as lifting.  Severe coughing.  What increases the risk? You may be at higher risk for this condition if you:  Are female.  Are 30?34 years old.  Recently started a new exercise or work activity.  Have low levels of vitamin D.  Have a condition that makes you cough frequently.  What are the signs or symptoms? The main symptom of this condition is chest pain. The pain:  Usually starts gradually and can be sharp or dull.  Gets worse with deep breathing, coughing, or exercise.  Gets better with rest.  May be worse when you press on the sternum-rib connection (tenderness).  How is this diagnosed? This condition is diagnosed based on your symptoms, medical history, and a physical exam. Your health care provider will check for tenderness when pressing on your sternum. This is the most important finding. You may also have tests to rule out other causes of chest pain. These may include:  A chest X-ray to check for lung problems.  An electrocardiogram (ECG) to see if you have a heart problem that could be causing the pain.  An imaging scan to rule out a chest or rib fracture.  How is this treated? This condition usually goes away on its own over time. Your health care provider may prescribe an NSAID to reduce pain and inflammation. Your health care provider may also suggest that you:  Rest and avoid activities that make pain worse.  Apply heat or cold to the area to reduce pain  and inflammation.  Do exercises to stretch your chest muscles.  If these treatments do not help, your health care provider may inject a numbing medicine at the sternum-rib connection to help relieve the pain. Follow these instructions at home:  Avoid activities that make pain worse. This includes any activities that use chest, abdominal, and side muscles.  If directed, put ice on the painful area: ? Put ice in a plastic bag. ? Place a towel between your skin and the bag. ? Leave the ice on for 20 minutes, 2-3 times a day.  If directed, apply heat to the affected area as often as told by your health care provider. Use the heat source that your health care provider recommends, such as a moist heat pack or a heating pad. ? Place a towel between your skin and the heat source. ? Leave the heat on for 20-30 minutes. ? Remove the heat if your skin turns bright red. This is especially important if you are unable to feel pain, heat, or cold. You may have a greater risk of getting burned.  Take over-the-counter and prescription medicines only as told by your health care provider.  Return to your normal activities as told by your health care provider. Ask your health care provider what activities are safe for you.  Keep all follow-up visits as told by your health care provider. This is important. Contact a health care provider if:  You have chills or a fever.  Your pain does not go   away or it gets worse.  You have a cough that does not go away (is persistent). Get help right away if:  You have shortness of breath. This information is not intended to replace advice given to you by your health care provider. Make sure you discuss any questions you have with your health care provider. Document Released: 05/16/2005 Document Revised: 02/24/2016 Document Reviewed: 11/30/2015 Elsevier Interactive Patient Education  2018 Elsevier Inc.   

## 2017-11-25 ENCOUNTER — Encounter: Payer: Self-pay | Admitting: Physician Assistant

## 2017-11-25 ENCOUNTER — Telehealth: Payer: Self-pay

## 2017-11-25 DIAGNOSIS — R9431 Abnormal electrocardiogram [ECG] [EKG]: Secondary | ICD-10-CM | POA: Insufficient documentation

## 2017-11-25 NOTE — Telephone Encounter (Signed)
Viewed by Danae Orleans on 11/25/2017 9:27 AM on Mychart.

## 2017-11-25 NOTE — Telephone Encounter (Signed)
-----   Message from Mar Daring, PA-C sent at 11/25/2017  8:45 AM EDT ----- CXR is clear. I apologize for getting back to you late. I had a vertigo spell on Friday and had to leave the office early. Again I apologize. I hope your chest pain is improving.

## 2017-12-25 ENCOUNTER — Telehealth: Payer: Self-pay | Admitting: Emergency Medicine

## 2017-12-25 ENCOUNTER — Encounter: Payer: Self-pay | Admitting: Physician Assistant

## 2017-12-25 ENCOUNTER — Other Ambulatory Visit: Payer: Self-pay

## 2017-12-25 ENCOUNTER — Ambulatory Visit: Payer: BC Managed Care – PPO | Admitting: Physician Assistant

## 2017-12-25 VITALS — BP 100/80 | HR 93 | Temp 99.2°F | Wt 136.0 lb

## 2017-12-25 DIAGNOSIS — B029 Zoster without complications: Secondary | ICD-10-CM | POA: Diagnosis not present

## 2017-12-25 DIAGNOSIS — R9431 Abnormal electrocardiogram [ECG] [EKG]: Secondary | ICD-10-CM | POA: Diagnosis not present

## 2017-12-25 DIAGNOSIS — R079 Chest pain, unspecified: Secondary | ICD-10-CM

## 2017-12-25 MED ORDER — VALACYCLOVIR HCL 1 G PO TABS: 1000.0000 mg | ORAL_TABLET | Freq: Three times a day (TID) | ORAL | 0 refills | Status: DC

## 2017-12-25 NOTE — Patient Instructions (Signed)
Shingles Shingles, which is also known as herpes zoster, is an infection that causes a painful skin rash and fluid-filled blisters. Shingles is not related to genital herpes, which is a sexually transmitted infection. Shingles only develops in people who:  Have had chickenpox.  Have received the chickenpox vaccine. (This is rare.)  What are the causes? Shingles is caused by varicella-zoster virus (VZV). This is the same virus that causes chickenpox. After exposure to VZV, the virus stays in the body in an inactive (dormant) state. Shingles develops if the virus reactivates. This can happen many years after the initial exposure to VZV. It is not known what causes this virus to reactivate. What increases the risk? People who have had chickenpox or received the chickenpox vaccine are at risk for shingles. Infection is more common in people who:  Are older than age 50.  Have a weakened defense (immune) system, such as those with HIV, AIDS, or cancer.  Are taking medicines that weaken the immune system, such as transplant medicines.  Are under great stress.  What are the signs or symptoms? Early symptoms of this condition include itching, tingling, and pain in an area on your skin. Pain may be described as burning, stabbing, or throbbing. A few days or weeks after symptoms start, a painful red rash appears, usually on one side of the body in a bandlike or beltlike pattern. The rash eventually turns into fluid-filled blisters that break open, scab over, and dry up in about 2-3 weeks. At any time during the infection, you may also develop:  A fever.  Chills.  A headache.  An upset stomach.  How is this diagnosed? This condition is diagnosed with a skin exam. Sometimes, skin or fluid samples are taken from the blisters before a diagnosis is made. These samples are examined under a microscope or sent to a lab for testing. How is this treated? There is no specific cure for this condition.  Your health care provider will probably prescribe medicines to help you manage pain, recover more quickly, and avoid long-term problems. Medicines may include:  Antiviral drugs.  Anti-inflammatory drugs.  Pain medicines.  If the area involved is on your face, you may be referred to a specialist, such as an eye doctor (ophthalmologist) or an ear, nose, and throat (ENT) doctor to help you avoid eye problems, chronic pain, or disability. Follow these instructions at home: Medicines  Take medicines only as directed by your health care provider.  Apply an anti-itch or numbing cream to the affected area as directed by your health care provider. Blister and Rash Care  Take a cool bath or apply cool compresses to the area of the rash or blisters as directed by your health care provider. This may help with pain and itching.  Keep your rash covered with a loose bandage (dressing). Wear loose-fitting clothing to help ease the pain of material rubbing against the rash.  Keep your rash and blisters clean with mild soap and cool water or as directed by your health care provider.  Check your rash every day for signs of infection. These include redness, swelling, and pain that lasts or increases.  Do not pick your blisters.  Do not scratch your rash. General instructions  Rest as directed by your health care provider.  Keep all follow-up visits as directed by your health care provider. This is important.  Until your blisters scab over, your infection can cause chickenpox in people who have never had it or been vaccinated   against it. To prevent this from happening, avoid contact with other people, especially: ? Babies. ? Pregnant women. ? Children who have eczema. ? Elderly people who have transplants. ? People who have chronic illnesses, such as leukemia or AIDS. Contact a health care provider if:  Your pain is not relieved with prescribed medicines.  Your pain does not get better after  the rash heals.  Your rash looks infected. Signs of infection include redness, swelling, and pain that lasts or increases. Get help right away if:  The rash is on your face or nose.  You have facial pain, pain around your eye area, or loss of feeling on one side of your face.  You have ear pain or you have ringing in your ear.  You have loss of taste.  Your condition gets worse. This information is not intended to replace advice given to you by your health care provider. Make sure you discuss any questions you have with your health care provider. Document Released: 08/06/2005 Document Revised: 04/01/2016 Document Reviewed: 06/17/2014 Elsevier Interactive Patient Education  2018 Elsevier Inc.    Valacyclovir caplets What is this medicine? VALACYCLOVIR (val ay SYE kloe veer) is an antiviral medicine. It is used to treat or prevent infections caused by certain kinds of viruses. Examples of these infections include herpes and shingles. This medicine will not cure herpes. This medicine may be used for other purposes; ask your health care provider or pharmacist if you have questions. COMMON BRAND NAME(S): Valtrex What should I tell my health care provider before I take this medicine? They need to know if you have any of these conditions: -acquired immunodeficiency syndrome (AIDS) -any other condition that may weaken the immune system -bone marrow or kidney transplant -kidney disease -an unusual or allergic reaction to valacyclovir, acyclovir, ganciclovir, valganciclovir, other medicines, foods, dyes, or preservatives -pregnant or trying to get pregnant -breast-feeding How should I use this medicine? Take this medicine by mouth with a glass of water. Follow the directions on the prescription label. You can take this medicine with or without food. Take your doses at regular intervals. Do not take your medicine more often than directed. Finish the full course prescribed by your doctor or  health care professional even if you think your condition is better. Do not stop taking except on the advice of your doctor or health care professional. Talk to your pediatrician regarding the use of this medicine in children. While this drug may be prescribed for children as young as 2 years for selected conditions, precautions do apply. Overdosage: If you think you have taken too much of this medicine contact a poison control center or emergency room at once. NOTE: This medicine is only for you. Do not share this medicine with others. What if I miss a dose? If you miss a dose, take it as soon as you can. If it is almost time for your next dose, take only that dose. Do not take double or extra doses. What may interact with this medicine? -cimetidine -probenecid This list may not describe all possible interactions. Give your health care provider a list of all the medicines, herbs, non-prescription drugs, or dietary supplements you use. Also tell them if you smoke, drink alcohol, or use illegal drugs. Some items may interact with your medicine. What should I watch for while using this medicine? Tell your doctor or health care professional if your symptoms do not start to get better after 1 week. This medicine works best when taken   early in the course of an infection, within the first 72 hours. Begin treatment as soon as possible after the first signs of infection like tingling, itching, or pain in the affected area. It is possible that genital herpes may still be spread even when you are not having symptoms. Always use safer sex practices like condoms made of latex or polyurethane whenever you have sexual contact. You should stay well hydrated while taking this medicine. Drink plenty of fluids. What side effects may I notice from receiving this medicine? Side effects that you should report to your doctor or health care professional as soon as possible: -allergic reactions like skin rash, itching or  hives, swelling of the face, lips, or tongue -aggressive behavior -confusion -hallucinations -problems with balance, talking, walking -stomach pain -tremor -trouble passing urine or change in the amount of urine Side effects that usually do not require medical attention (report to your doctor or health care professional if they continue or are bothersome): -dizziness -headache -nausea, vomiting This list may not describe all possible side effects. Call your doctor for medical advice about side effects. You may report side effects to FDA at 1-800-FDA-1088. Where should I keep my medicine? Keep out of the reach of children. Store at room temperature between 15 and 25 degrees C (59 and 77 degrees F). Keep container tightly closed. Throw away any unused medicine after the expiration date. NOTE: This sheet is a summary. It may not cover all possible information. If you have questions about this medicine, talk to your doctor, pharmacist, or health care provider.  2018 Elsevier/Gold Standard (2012-07-22 16:34:05)   

## 2017-12-25 NOTE — Progress Notes (Signed)
Patient: Valerie West Female    DOB: 28-Feb-1986   32 y.o.   MRN: [id number] Visit Date: 12/25/2017  Today's Provider: Mar Daring, PA-C   Chief Complaint  Patient presents with  . Chest Pain   Subjective:    Chest Pain   This is a new problem. The current episode started today. The onset quality is sudden. The problem occurs constantly. The pain is at a severity of 6/10. The pain is mild. Quality: shooting discomfort in waves. The pain does not radiate. Associated symptoms include a cough, headaches and malaise/fatigue. Pertinent negatives include no fever or shortness of breath. Past medical history comments: migraine   Patient does have h/o carcinoid tumor of the left lung. She is scheduled for her repeat chest CT in the coming weeks.     No Known Allergies   Current Outpatient Medications:  .  ALPRAZolam (XANAX) 0.25 MG tablet, TAKE ONE TABLET DAILY BY MOUTH AS NEEDED FOR ANXIETY, Disp: 90 tablet, Rfl: 1 .  escitalopram (LEXAPRO) 20 MG tablet, Take 1 tablet (20 mg total) by mouth daily., Disp: 90 tablet, Rfl: 3 .  Melatonin-Pyridoxine ([employer]S MELATONIN) 5-10 MG TBCR, Take by mouth., Disp: , Rfl:  .  Multiple Vitamin (MULTIVITAMIN) capsule, Take by mouth., Disp: , Rfl:  .  rizatriptan (MAXALT) 10 MG tablet, , Disp: , Rfl:   Review of Systems  Constitutional: Positive for malaise/fatigue. Negative for fever.  Respiratory: Positive for cough. Negative for shortness of breath.   Cardiovascular: Positive for chest pain.  Gastrointestinal: Negative.   Genitourinary: Negative.   Neurological: Positive for headaches.    Social History   Tobacco Use  . Smoking status: Never Smoker  . Smokeless tobacco: Never Used  Substance Use Topics  . Alcohol use: Yes    Alcohol/week: 0.0 oz    Comment: occassional   Objective:   BP 100/80 (BP Location: Left Arm, Patient Position: Sitting, Cuff Size: Normal)   Pulse 93   Temp 99.2 F (37.3 C) (Oral)   Wt 136 lb (61.7 kg)    LMP 12/16/2017   SpO2 96%   BMI 24.09 kg/m  Vitals:   12/25/17 1348  BP: 100/80  Pulse: 93  Temp: 99.2 F (37.3 C)  TempSrc: Oral  SpO2: 96%  Weight: 136 lb (61.7 kg)     Physical Exam  Constitutional: She appears well-developed and well-nourished. No distress.  Neck: Normal range of motion. Neck supple. No JVD present. No tracheal deviation present. No thyromegaly present.  Cardiovascular: Normal rate, regular rhythm, normal heart sounds, intact distal pulses and normal pulses. Exam reveals no gallop and no friction rub.  No murmur heard. Pulmonary/Chest: Effort normal and breath sounds normal. No respiratory distress. She has no wheezes. She has no rales.  Musculoskeletal:       Right lower leg: Normal.       Left lower leg: Normal.  Lymphadenopathy:    She has no cervical adenopathy.  Skin: She is not diaphoretic.  Vitals reviewed.      Assessment & Plan:     1. Shortened PR interval EKG repeated today. NSR rate of 68 with short PR. PR segment is slightly improved, had been 158ms and is now 139ms, so very borderline.  - EKG 12-Lead  2. Chest pain, unspecified type See above medical treatment plan. Due to the area of symptoms and description of pain (dull pain with occasional shooting pain in a dermatomal pattern) I am concerned this  may be a prodrome for shingles vs non-eruption shingles. Will give valtrex as below for possible treatment of this. She is to call if symptoms continue or fail to improve. Follow up with Dr. Laverta [place], [facility] surgical oncology, at the end of the month.  - EKG 12-Lead  3. Zoster without complications See above medical treatment plan. - valACYclovir (VALTREX) 1000 MG tablet; Take 1 tablet (1,000 mg total) by mouth 3 (three) times daily.  Dispense: 21 tablet; Refill: 0       Mar Daring, PA-C  [facility]

## 2017-12-25 NOTE — Telephone Encounter (Signed)
Pt called in to make an appt. She is having left sided chest pain. She reports that she woke up with this morning. She does not have any new shortness of breath, dizziness, palpitations, sweats or nausea. She is scheduled for 1:40 today.

## 2018-02-27 ENCOUNTER — Encounter: Payer: Self-pay | Admitting: Physician Assistant

## 2018-02-27 DIAGNOSIS — F411 Generalized anxiety disorder: Secondary | ICD-10-CM

## 2018-02-28 MED ORDER — ALPRAZOLAM 0.25 MG PO TABS: ORAL_TABLET | ORAL | 1 refills | Status: DC

## 2018-06-18 ENCOUNTER — Encounter: Payer: BC Managed Care – PPO | Admitting: Physician Assistant

## 2018-06-20 ENCOUNTER — Encounter: Payer: Self-pay | Admitting: Family Medicine

## 2018-06-20 ENCOUNTER — Ambulatory Visit: Payer: BC Managed Care – PPO | Admitting: Family Medicine

## 2018-06-20 VITALS — BP 102/64 | HR 83 | Temp 98.4°F | Resp 16 | Wt 147.0 lb

## 2018-06-20 DIAGNOSIS — R1013 Epigastric pain: Secondary | ICD-10-CM

## 2018-06-20 NOTE — Patient Instructions (Signed)
We will call you with the lab results. Start the acid blocker. You may also take antacids with it as needed.

## 2018-06-20 NOTE — Progress Notes (Signed)
  Subjective:     Patient ID: Valerie West, female   DOB: 1986-03-01, 32 y.o.   MRN: [id number] Chief Complaint  Patient presents with  . Abdominal Pain    Patient comes into office today with complaints of Abdominal pain located in middle of abdomen. Patient states that pain radiates to her LUQ and describes it as achy. Patient reports feeling more bloated, she denies back pain, constipation or fever. Patient has been taking otc Gas X, Alka-Seltzer, Tums, Aleve and Tylenol   HPI States the pain waxes and wanes from 5 to 7/10. She is moving her bowels well and reports minimal use of ETOH. No heartburn or reflux reported.  Review of Systems     Objective:   Physical Exam  Constitutional: She appears well-developed and well-nourished. She does not appear ill. No distress.  Abdominal: Bowel sounds are normal. She exhibits no distension. There is tenderness (mild in upper epigastric area).       Assessment:    1. Epigastric pain: start Dexilant (#15 samples provided) - Comprehensive metabolic panel - Lipase - CBC with Differential/Platelet - H. pylori breath test    Plan:    Further f/u pending lab results.

## 2018-06-21 LAB — COMPREHENSIVE METABOLIC PANEL
ALBUMIN: 4.6 g/dL (ref 3.5–5.5)
ALT: 6 IU/L (ref 0–32)
AST: 16 IU/L (ref 0–40)
Albumin/Globulin Ratio: 2.3 — ABNORMAL HIGH (ref 1.2–2.2)
Alkaline Phosphatase: 56 IU/L (ref 39–117)
BUN / CREAT RATIO: 19 (ref 9–23)
BUN: 14 mg/dL (ref 6–20)
Bilirubin Total: 0.5 mg/dL (ref 0.0–1.2)
CALCIUM: 9.1 mg/dL (ref 8.7–10.2)
CO2: 23 mmol/L (ref 20–29)
CREATININE: 0.73 mg/dL (ref 0.57–1.00)
Chloride: 100 mmol/L (ref 96–106)
GFR, EST AFRICAN AMERICAN: 124 mL/min/{1.73_m2} (ref >59)
GFR, EST NON AFRICAN AMERICAN: 108 mL/min/{1.73_m2} (ref >59)
GLOBULIN, TOTAL: 2 g/dL (ref 1.5–4.5)
Glucose: 81 mg/dL (ref 65–99)
Potassium: 4.2 mmol/L (ref 3.5–5.2)
SODIUM: 139 mmol/L (ref 134–144)
Total Protein: 6.6 g/dL (ref 6.0–8.5)

## 2018-06-21 LAB — CBC WITH DIFFERENTIAL/PLATELET
Basophils Absolute: 0 10*3/uL (ref 0.0–0.2)
Basos: 1 %
EOS (ABSOLUTE): 0.2 10*3/uL (ref 0.0–0.4)
EOS: 3 %
HEMATOCRIT: 39 % (ref 34.0–46.6)
HEMOGLOBIN: 13.4 g/dL (ref 11.1–15.9)
Immature Grans (Abs): 0 10*3/uL (ref 0.0–0.1)
Immature Granulocytes: 0 %
LYMPHS ABS: 2.3 10*3/uL (ref 0.7–3.1)
Lymphs: 34 %
MCH: 32.4 pg (ref 26.6–33.0)
MCHC: 34.4 g/dL (ref 31.5–35.7)
MCV: 94 fL (ref 79–97)
MONOCYTES: 8 %
Monocytes Absolute: 0.5 10*3/uL (ref 0.1–0.9)
NEUTROS ABS: 3.6 10*3/uL (ref 1.4–7.0)
Neutrophils: 54 %
Platelets: 247 10*3/uL (ref 150–450)
RBC: 4.13 x10E6/uL (ref 3.77–5.28)
RDW: 11.9 % — ABNORMAL LOW (ref 12.3–15.4)
WBC: 6.6 10*3/uL (ref 3.4–10.8)

## 2018-06-21 LAB — LIPASE: Lipase: 27 U/L (ref 14–72)

## 2018-06-21 LAB — H. PYLORI BREATH TEST: H pylori Breath Test: NEGATIVE

## 2018-06-23 ENCOUNTER — Other Ambulatory Visit: Payer: Self-pay | Admitting: Physician Assistant

## 2018-06-23 ENCOUNTER — Other Ambulatory Visit: Payer: Self-pay | Admitting: Family Medicine

## 2018-06-23 DIAGNOSIS — F53 Postpartum depression: Secondary | ICD-10-CM

## 2018-06-23 DIAGNOSIS — R1013 Epigastric pain: Secondary | ICD-10-CM

## 2018-06-23 DIAGNOSIS — O99345 Other mental disorders complicating the puerperium: Principal | ICD-10-CM

## 2018-06-27 ENCOUNTER — Telehealth: Payer: Self-pay

## 2018-06-27 ENCOUNTER — Ambulatory Visit
Admission: RE | Admit: 2018-06-27 | Discharge: 2018-06-27 | Disposition: A | Discharge status: Home / Self Care | Payer: BC Managed Care – PPO | Source: Ambulatory Visit | Attending: Family Medicine | Admitting: Family Medicine

## 2018-06-27 DIAGNOSIS — R1013 Epigastric pain: Secondary | ICD-10-CM | POA: Diagnosis present

## 2018-06-27 NOTE — Telephone Encounter (Signed)
Patient was advised.  

## 2018-06-27 NOTE — Telephone Encounter (Signed)
Patient advised of results. She states that her pain has significantly improved. She wants to know if she should continue taking the Acid blocker? If so, how long should she take it for? Please advise. If patient doesn't answer her phone when we call back, she says it is ok to leave a detailed message on her voice message system. Her call back number is [phone].

## 2018-06-27 NOTE — Telephone Encounter (Signed)
Complete Dexilant then take otc Prevacid 30 mg. Daily x 2 weeks.

## 2018-06-27 NOTE — Telephone Encounter (Signed)
-----   Message from Carmon Ginsberg, [place] sent at 06/27/2018 11:37 AM EST ----- Ultrasound normal. Is she feeling better?

## 2018-07-07 ENCOUNTER — Ambulatory Visit (INDEPENDENT_AMBULATORY_CARE_PROVIDER_SITE_OTHER): Payer: BC Managed Care – PPO | Admitting: Physician Assistant

## 2018-07-07 ENCOUNTER — Encounter: Payer: Self-pay | Admitting: Physician Assistant

## 2018-07-07 VITALS — BP 100/70 | HR 72 | Temp 99.0°F | Resp 16 | Ht 63.0 in | Wt 147.4 lb

## 2018-07-07 DIAGNOSIS — Z136 Encounter for screening for cardiovascular disorders: Secondary | ICD-10-CM | POA: Diagnosis not present

## 2018-07-07 DIAGNOSIS — Z0000 Encounter for general adult medical examination without abnormal findings: Secondary | ICD-10-CM | POA: Diagnosis not present

## 2018-07-07 DIAGNOSIS — Z13220 Encounter for screening for lipoid disorders: Secondary | ICD-10-CM | POA: Diagnosis not present

## 2018-07-07 NOTE — Progress Notes (Signed)
Patient: Valerie West, Female    DOB: 06/19/1986, 32 y.o.   MRN: [id number] Visit Date: 07/07/2018  Today's Provider: Mar Daring, PA-C   Chief Complaint  Patient presents with  . Annual Exam   Subjective:    Annual physical exam Valerie West is a 32 y.o. female who presents today for health maintenance and complete physical. She feels well. She reports exercising. She reports she is sleeping well with the Melatonin.  Pap:08/14/16-Normal,HPV-Negative -----------------------------------------------------------------   Review of Systems  Constitutional: Negative.   HENT: Negative.   Eyes: Negative.   Respiratory: Positive for shortness of breath.   Cardiovascular: Negative.   Gastrointestinal: Positive for abdominal pain ("not since Wednesday").  Endocrine: Negative.   Genitourinary: Negative.   Musculoskeletal: Positive for back pain ("From surgery").  Skin: Negative.   Allergic/Immunologic: Negative.   Neurological: Positive for headaches.  Hematological: Negative.   Psychiatric/Behavioral: The patient is nervous/anxious.     Social History      She  reports that she has never smoked. She has never used smokeless tobacco. She reports that she drinks alcohol. She reports that she does not use drugs.       Social History   Socioeconomic History  . Marital status: Married    Spouse name: Louie Casa  . Number of children: 2  . Years of education: 6  . Highest education level: Not on file  Occupational History  . Occupation: Product manager: [employer]  Social Needs  . Financial resource strain: Not on file  . Food insecurity:    Worry: Not on file    Inability: Not on file  . Transportation needs:    Medical: Not on file    Non-medical: Not on file  Tobacco Use  . Smoking status: Never Smoker  . Smokeless tobacco: Never Used  Substance and Sexual Activity  . Alcohol use: Yes    Alcohol/week: 0.0 standard drinks    Comment: occassional  .  Drug use: No  . Sexual activity: Not on file  Lifestyle  . Physical activity:    Days per week: Not on file    Minutes per session: Not on file  . Stress: Not on file  Relationships  . Social connections:    Talks on phone: Not on file    Gets together: Not on file    Attends religious service: Not on file    Active member of club or organization: Not on file    Attends meetings of clubs or organizations: Not on file    Relationship status: Not on file  Other Topics Concern  . Not on file  Social History Narrative  . Not on file    Past Medical History:  Diagnosis Date  . Anxiety   . Depression during pregnancy, antepartum unk     Patient Active Problem List   Diagnosis Date Noted  . 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 S/P laparoscopic appendectomy 06/17/2017  . 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 Vitamin B12 deficiency 03/15/2015  . Migraine 02/14/2015  . Anxiety 01/27/2015  . Myofascial pain 01/27/2015  . Cervical muscle strain 01/27/2015  . Depression, postpartum 10/26/2013    Past Surgical History:  Procedure Laterality Date  . LAPAROSCOPIC APPENDECTOMY N/A 06/23/2016   Procedure: APPENDECTOMY LAPAROSCOPIC;  Surgeon: Clayburn Pert, MD;  Location: [facility] ORS;  Service: General;  Laterality: N/A;  . LOBECTOMY Left 12/07/2016  Family History        Family Status  Relation Name Status  . Father  Alive  . MGM  Alive  . MGF  Alive  . Mother  Alive        Her family history includes Alcohol abuse in her father; Bipolar disorder in her father; Cancer in her maternal grandfather and maternal grandmother; Depression in her father; Diabetes in her maternal grandfather and maternal grandmother; Drug abuse in her father; Hypertension in her mother.      No Known Allergies   Current Outpatient Medications:  .  ALPRAZolam (XANAX) 0.25 MG tablet, TAKE ONE TABLET DAILY BY MOUTH AS NEEDED FOR ANXIETY, Disp: 90 tablet, Rfl:  1 .  ERRIN 0.35 MG tablet, Take 1 tablet by mouth daily., Disp: , Rfl: 4 .  escitalopram (LEXAPRO) 20 MG tablet, TAKE 1 TABLET BY MOUTH EVERY DAY, Disp: 90 tablet, Rfl: 3 .  Melatonin-Pyridoxine ([facility] MELATONIN) 5-10 MG TBCR, Take by mouth., Disp: , Rfl:  .  Multiple Vitamin (MULTIVITAMIN) capsule, Take by mouth., Disp: , Rfl:  .  rizatriptan (MAXALT) 10 MG tablet, , Disp: , Rfl:    Patient Care Team: Rubye Beach as PCP - General (Physician Assistant)      Objective:   Vitals: BP 100/70 (BP Location: Left Arm, Patient Position: Sitting, Cuff Size: Normal)   Pulse 72   Temp 99 F (37.2 C) (Oral)   Resp 16   Ht 5\' 3"  (1.6 m)   Wt 147 lb 6.4 oz (66.9 kg)   SpO2 98%   BMI 26.11 kg/m    Vitals:   07/07/18 1514  BP: 100/70  Pulse: 72  Resp: 16  Temp: 99 F (37.2 C)  TempSrc: Oral  SpO2: 98%  Weight: 147 lb 6.4 oz (66.9 kg)  Height: 5\' 3"  (1.6 m)     Physical Exam  Constitutional: She is oriented to person, place, and time. She appears well-developed and well-nourished. No distress.  HENT:  Head: Normocephalic and atraumatic.  Right Ear: Hearing, tympanic membrane, external ear and ear canal normal.  Left Ear: Hearing, tympanic membrane, external ear and ear canal normal.  Nose: Nose normal.  Mouth/Throat: Uvula is midline, oropharynx is clear and moist and mucous membranes are normal. No oropharyngeal exudate.  Eyes: Pupils are equal, round, and reactive to light. Conjunctivae and EOM are normal. Right eye exhibits no discharge. Left eye exhibits no discharge. No scleral icterus.  Neck: Normal range of motion. Neck supple. No JVD present. No tracheal deviation present. No thyromegaly present.  Cardiovascular: Normal rate, regular rhythm, normal heart sounds and intact distal pulses. Exam reveals no gallop and no friction rub.  No murmur heard. Pulmonary/Chest: Effort normal and breath sounds normal. No respiratory distress. She has no wheezes. She has no  rales. She exhibits no tenderness.  Abdominal: Soft. Bowel sounds are normal. She exhibits no distension and no mass. There is no tenderness. There is no rebound and no guarding.  Musculoskeletal: Normal range of motion. She exhibits no edema or tenderness.  Lymphadenopathy:    She has no cervical adenopathy.  Neurological: She is alert and oriented to person, place, and time.  Skin: Skin is warm and dry. No rash noted. She is not diaphoretic.  Psychiatric: She has a normal mood and affect. Her behavior is normal. Judgment and thought content normal.  Vitals reviewed.    Depression Screen PHQ 2/9 Scores 07/07/2018 06/17/2017  PHQ - 2 Score 0 0  PHQ-  9 Score 3 7      Assessment & Plan:     Routine Health Maintenance and Physical Exam  Exercise Activities and Dietary recommendations Goals   None     Immunization History  Administered Date(s) Administered  . Influenza,inj,Quad PF,6+ Mos 06/15/2015, 06/17/2017  . Influenza-Unspecified 05/28/2018  . Tdap 12/22/2013    Health Maintenance  Topic Date Due  . HIV Screening  04/17/1999  . PAP SMEAR  09/20/2018  . TETANUS/TDAP  12/23/2023  . INFLUENZA VACCINE  Completed     Discussed health benefits of physical activity, and encouraged her to engage in regular exercise appropriate for her age and condition.    1. Annual physical exam Normal physical exam today. Will check labs as below and f/u pending lab results. If labs are stable and WNL she will not need to have these rechecked for one year at her next annual physical exam. She is to call the office in the meantime if she has any acute issue, questions or concerns. - CBC with Differential/Platelet - Comprehensive metabolic panel - Hemoglobin A1c - TSH  2. Encounter for lipid screening for cardiovascular disease Will check labs as below and f/u pending results. - Lipid panel  --------------------------------------------------------------------    Mar Daring, PA-C  [facility]

## 2018-07-07 NOTE — Patient Instructions (Signed)

## 2018-07-11 LAB — CBC WITH DIFFERENTIAL/PLATELET
BASOS: 1 %
Basophils Absolute: 0 10*3/uL (ref 0.0–0.2)
EOS (ABSOLUTE): 0.1 10*3/uL (ref 0.0–0.4)
EOS: 3 %
HEMATOCRIT: 40.9 % (ref 34.0–46.6)
HEMOGLOBIN: 14 g/dL (ref 11.1–15.9)
IMMATURE GRANS (ABS): 0 10*3/uL (ref 0.0–0.1)
Immature Granulocytes: 0 %
LYMPHS: 29 %
Lymphocytes Absolute: 1.4 10*3/uL (ref 0.7–3.1)
MCH: 32.7 pg (ref 26.6–33.0)
MCHC: 34.2 g/dL (ref 31.5–35.7)
MCV: 96 fL (ref 79–97)
MONOCYTES: 8 %
Monocytes Absolute: 0.4 10*3/uL (ref 0.1–0.9)
Neutrophils Absolute: 2.9 10*3/uL (ref 1.4–7.0)
Neutrophils: 59 %
Platelets: 203 10*3/uL (ref 150–450)
RBC: 4.28 x10E6/uL (ref 3.77–5.28)
RDW: 11.6 % — ABNORMAL LOW (ref 12.3–15.4)
WBC: 4.9 10*3/uL (ref 3.4–10.8)

## 2018-07-11 LAB — COMPREHENSIVE METABOLIC PANEL
A/G RATIO: 2.6 — AB (ref 1.2–2.2)
ALBUMIN: 4.7 g/dL (ref 3.5–5.5)
ALT: 6 IU/L (ref 0–32)
AST: 14 IU/L (ref 0–40)
Alkaline Phosphatase: 53 IU/L (ref 39–117)
BILIRUBIN TOTAL: 0.7 mg/dL (ref 0.0–1.2)
BUN / CREAT RATIO: 16 (ref 9–23)
BUN: 12 mg/dL (ref 6–20)
CHLORIDE: 100 mmol/L (ref 96–106)
CO2: 23 mmol/L (ref 20–29)
Calcium: 9.3 mg/dL (ref 8.7–10.2)
Creatinine, Ser: 0.76 mg/dL (ref 0.57–1.00)
GFR, EST AFRICAN AMERICAN: 118 mL/min/{1.73_m2} (ref >59)
GFR, EST NON AFRICAN AMERICAN: 103 mL/min/{1.73_m2} (ref >59)
Globulin, Total: 1.8 g/dL (ref 1.5–4.5)
Glucose: 81 mg/dL (ref 65–99)
POTASSIUM: 4.3 mmol/L (ref 3.5–5.2)
Sodium: 138 mmol/L (ref 134–144)
TOTAL PROTEIN: 6.5 g/dL (ref 6.0–8.5)

## 2018-07-11 LAB — LIPID PANEL
CHOL/HDL RATIO: 1.9 ratio (ref 0.0–4.4)
Cholesterol, Total: 128 mg/dL (ref 100–199)
HDL: 69 mg/dL (ref >39)
LDL CALC: 51 mg/dL (ref 0–99)
TRIGLYCERIDES: 39 mg/dL (ref 0–149)
VLDL CHOLESTEROL CAL: 8 mg/dL (ref 5–40)

## 2018-07-11 LAB — HEMOGLOBIN A1C
Est. average glucose Bld gHb Est-mCnc: 103 mg/dL
Hgb A1c MFr Bld: 5.2 % (ref 4.8–5.6)

## 2018-07-11 LAB — TSH: TSH: 2.3 u[IU]/mL (ref 0.450–4.500)

## 2018-07-15 ENCOUNTER — Encounter: Payer: Self-pay | Admitting: Physician Assistant

## 2018-09-26 ENCOUNTER — Other Ambulatory Visit: Payer: Self-pay | Admitting: Physician Assistant

## 2018-09-26 DIAGNOSIS — F411 Generalized anxiety disorder: Secondary | ICD-10-CM

## 2018-11-13 ENCOUNTER — Encounter: Payer: Self-pay | Admitting: Physician Assistant

## 2018-11-13 DIAGNOSIS — F4323 Adjustment disorder with mixed anxiety and depressed mood: Secondary | ICD-10-CM

## 2018-11-14 MED ORDER — CITALOPRAM HYDROBROMIDE 40 MG PO TABS: 40.0000 mg | ORAL_TABLET | Freq: Every day | ORAL | 1 refills | Status: DC

## 2018-11-14 NOTE — Addendum Note (Signed)
Addended by: Mar Daring on: 11/14/2018 03:40 PM   Modules accepted: Orders

## 2018-12-10 ENCOUNTER — Encounter: Payer: Self-pay | Admitting: Physician Assistant

## 2018-12-10 DIAGNOSIS — K219 Gastro-esophageal reflux disease without esophagitis: Secondary | ICD-10-CM

## 2018-12-10 MED ORDER — DEXLANSOPRAZOLE 60 MG PO CPDR: 60.0000 mg | DELAYED_RELEASE_CAPSULE | Freq: Every day | ORAL | 0 refills | Status: DC

## 2018-12-10 NOTE — Addendum Note (Signed)
Addended by: Mar Daring on: 12/10/2018 01:37 PM   Modules accepted: Orders

## 2018-12-12 ENCOUNTER — Other Ambulatory Visit: Payer: Self-pay | Admitting: Physician Assistant

## 2018-12-12 DIAGNOSIS — F4323 Adjustment disorder with mixed anxiety and depressed mood: Secondary | ICD-10-CM

## 2019-03-04 ENCOUNTER — Other Ambulatory Visit: Payer: Self-pay | Admitting: Physician Assistant

## 2019-03-04 DIAGNOSIS — K219 Gastro-esophageal reflux disease without esophagitis: Secondary | ICD-10-CM

## 2019-03-12 ENCOUNTER — Other Ambulatory Visit: Payer: Self-pay | Admitting: Physician Assistant

## 2019-03-12 DIAGNOSIS — F4323 Adjustment disorder with mixed anxiety and depressed mood: Secondary | ICD-10-CM

## 2019-04-04 ENCOUNTER — Other Ambulatory Visit: Payer: Self-pay | Admitting: Physician Assistant

## 2019-04-04 DIAGNOSIS — F4323 Adjustment disorder with mixed anxiety and depressed mood: Secondary | ICD-10-CM

## 2019-05-27 NOTE — Progress Notes (Signed)
Patient: Valerie West Female    DOB: 1986/07/10   33 y.o.   MRN: [id number] Visit Date: 05/27/2019  Today's Provider: Mar Daring, PA-C   No chief complaint on file.  Subjective:      Virtual Visit via Video Note  I connected with Rashell L Modesitt on 05/27/19 at  2:40 PM EDT by a video enabled telemedicine application and verified that I am speaking with the correct person using two identifiers.  Location: Patient: work Provider: home office   I discussed the limitations of evaluation and management by telemedicine and the availability of in person appointments. The patient expressed understanding and agreed to proceed.  Gastroesophageal Reflux She complains of belching, chest pain, early satiety, heartburn, nausea, a sore throat (mornings) and water brash. She reports no abdominal pain, no coughing, no dysphagia, no globus sensation or no hoarse voice. This is a new problem. The current episode started 1 to 4 weeks ago (2 weeks ago). The problem occurs frequently. The problem has been gradually worsening. The heartburn duration is an hour. The heartburn is located in the substernum. The heartburn is of mild intensity. The heartburn does not wake her from sleep. The heartburn does not limit her activity. The heartburn doesn't change with position. The symptoms are aggravated by stress. Pertinent negatives include no fatigue, melena or weight loss. There are no known risk factors (does have h/o carcinoid tumor of left lung s/p lobectomy). She has tried an antacid for the symptoms. The treatment provided no relief.    No Known Allergies   Current Outpatient Medications:  .  ALPRAZolam (XANAX) 0.25 MG tablet, TAKE ONE TABLET DAILY BY MOUTH AS NEEDED FOR ANXIETY, Disp: 90 tablet, Rfl: 1 .  citalopram (CELEXA) 40 MG tablet, TAKE 1 TABLET BY MOUTH EVERY DAY, Disp: 90 tablet, Rfl: 1 .  DEXILANT 60 MG capsule, TAKE 1 CAPSULE BY MOUTH EVERY DAY, Disp: 90 capsule, Rfl: 0 .   ERRIN 0.35 MG tablet, Take 1 tablet by mouth daily., Disp: , Rfl: 4 .  Melatonin-Pyridoxine ([employer] MELATONIN) 5-10 MG TBCR, Take by mouth., Disp: , Rfl:  .  Multiple Vitamin (MULTIVITAMIN) capsule, Take by mouth., Disp: , Rfl:  .  rizatriptan (MAXALT) 10 MG tablet, , Disp: , Rfl:   Review of Systems  Constitutional: Negative for fatigue and weight loss.  HENT: Positive for sore throat (mornings). Negative for congestion, hoarse voice, postnasal drip, sinus pressure, trouble swallowing and voice change.   Respiratory: Negative for cough, chest tightness and shortness of breath.   Cardiovascular: Positive for chest pain.  Gastrointestinal: Positive for abdominal distention, heartburn and nausea. Negative for abdominal pain, blood in stool, constipation, diarrhea, dysphagia, melena and vomiting.    Social History   Tobacco Use  . Smoking status: Never Smoker  . Smokeless tobacco: Never Used  Substance Use Topics  . Alcohol use: Yes    Alcohol/week: 0.0 standard drinks    Comment: occassional      Objective:   There were no vitals taken for this visit. There were no vitals filed for this visit.There is no height or weight on file to calculate BMI.   Physical Exam Vitals signs reviewed.  Constitutional:      General: She is not in acute distress.    Appearance: Normal appearance. She is well-developed. She is not ill-appearing.  HENT:     Head: Normocephalic and atraumatic.  Neck:     Musculoskeletal: Normal range of motion and  neck supple.  Pulmonary:     Effort: Pulmonary effort is normal. No respiratory distress.  Neurological:     Mental Status: She is alert.  Psychiatric:        Mood and Affect: Mood normal.        Behavior: Behavior normal.        Thought Content: Thought content normal.        Judgment: Judgment normal.      No results found for any visits on 05/28/19.     Assessment & Plan     1. Gastroesophageal reflux disease without esophagitis Will  restart dexilant as below. I will see her back in 2 weeks to see if improving (this has worked in the past). Had CT chest/abdomen in May 2020 with [facility] oncology, Dr. Laverta [place], that was normal. If no improvements will refer to GI for further evaluation.  - dexlansoprazole (DEXILANT) 60 MG capsule; Take 1 capsule (60 mg total) by mouth daily.  Dispense: 90 capsule; Refill: 0   I discussed the assessment and treatment plan with the patient. The patient was provided an opportunity to ask questions and all were answered. The patient agreed with the plan and demonstrated an understanding of the instructions.   The patient was advised to call back or seek an in-person evaluation if the symptoms worsen or if the condition fails to improve as anticipated.  I provided 14 minutes of non-face-to-face time during this encounter.    Mar Daring, PA-C  [facility]

## 2019-05-28 ENCOUNTER — Other Ambulatory Visit: Payer: Self-pay

## 2019-05-28 ENCOUNTER — Telehealth (INDEPENDENT_AMBULATORY_CARE_PROVIDER_SITE_OTHER): Payer: BC Managed Care – PPO | Admitting: Physician Assistant

## 2019-05-28 ENCOUNTER — Encounter: Payer: Self-pay | Admitting: Physician Assistant

## 2019-05-28 DIAGNOSIS — K219 Gastro-esophageal reflux disease without esophagitis: Secondary | ICD-10-CM

## 2019-05-28 MED ORDER — DEXILANT 60 MG PO CPDR: 60.0000 mg | DELAYED_RELEASE_CAPSULE | Freq: Every day | ORAL | 0 refills | Status: DC

## 2019-05-28 NOTE — Patient Instructions (Signed)
Gastroesophageal Reflux Disease, Adult Gastroesophageal reflux (GER) happens when acid from the stomach flows up into the tube that connects the mouth and the stomach (esophagus). Normally, food travels down the esophagus and stays in the stomach to be digested. With GER, food and stomach acid sometimes move back up into the esophagus. You may have a disease called gastroesophageal reflux disease (GERD) if the reflux:  Happens often.  Causes frequent or very bad symptoms.  Causes problems such as damage to the esophagus. When this happens, the esophagus becomes sore and swollen (inflamed). Over time, GERD can make small holes (ulcers) in the lining of the esophagus. What are the causes? This condition is caused by a problem with the muscle between the esophagus and the stomach. When this muscle is weak or not normal, it does not close properly to keep food and acid from coming back up from the stomach. The muscle can be weak because of:  Tobacco use.  Pregnancy.  Having a certain type of hernia (hiatal hernia).  Alcohol use.  Certain foods and drinks, such as coffee, chocolate, onions, and peppermint. What increases the risk? You are more likely to develop this condition if you:  Are overweight.  Have a disease that affects your connective tissue.  Use NSAID medicines. What are the signs or symptoms? Symptoms of this condition include:  Heartburn.  Difficult or painful swallowing.  The feeling of having a lump in the throat.  A bitter taste in the mouth.  Bad breath.  Having a lot of saliva.  Having an upset or bloated stomach.  Belching.  Chest pain. Different conditions can cause chest pain. Make sure you see your doctor if you have chest pain.  Shortness of breath or noisy breathing (wheezing).  Ongoing (chronic) cough or a cough at night.  Wearing away of the surface of teeth (tooth enamel).  Weight loss. How is this treated? Treatment will depend on how  bad your symptoms are. Your doctor may suggest:  Changes to your diet.  Medicine.  Surgery. Follow these instructions at home: Eating and drinking   Follow a diet as told by your doctor. You may need to avoid foods and drinks such as: ? Coffee and tea (with or without caffeine). ? Drinks that contain alcohol. ? Energy drinks and sports drinks. ? Bubbly (carbonated) drinks or sodas. ? Chocolate and cocoa. ? Peppermint and mint flavorings. ? Garlic and onions. ? Horseradish. ? Spicy and acidic foods. These include peppers, chili powder, curry powder, vinegar, hot sauces, and BBQ sauce. ? Citrus fruit juices and citrus fruits, such as oranges, lemons, and limes. ? Tomato-based foods. These include red sauce, chili, salsa, and pizza with red sauce. ? Fried and fatty foods. These include donuts, french fries, potato chips, and high-fat dressings. ? High-fat meats. These include hot dogs, rib eye steak, sausage, ham, and bacon. ? High-fat dairy items, such as whole milk, butter, and cream cheese.  Eat small meals often. Avoid eating large meals.  Avoid drinking large amounts of liquid with your meals.  Avoid eating meals during the 2-3 hours before bedtime.  Avoid lying down right after you eat.  Do not exercise right after you eat. Lifestyle   Do not use any products that contain nicotine or tobacco. These include cigarettes, e-cigarettes, and chewing tobacco. If you need help quitting, ask your doctor.  Try to lower your stress. If you need help doing this, ask your doctor.  If you are overweight, lose an amount   of weight that is healthy for you. Ask your doctor about a safe weight loss goal. General instructions  Pay attention to any changes in your symptoms.  Take over-the-counter and prescription medicines only as told by your doctor. Do not take aspirin, ibuprofen, or other NSAIDs unless your doctor says it is okay.  Wear loose clothes. Do not wear anything tight  around your waist.  Raise (elevate) the head of your bed about 6 inches (15 cm).  Avoid bending over if this makes your symptoms worse.  Keep all follow-up visits as told by your doctor. This is important. Contact a doctor if:  You have new symptoms.  You lose weight and you do not know why.  You have trouble swallowing or it hurts to swallow.  You have wheezing or a cough that keeps happening.  Your symptoms do not get better with treatment.  You have a hoarse voice. Get help right away if:  You have pain in your arms, neck, jaw, teeth, or back.  You feel sweaty, dizzy, or light-headed.  You have chest pain or shortness of breath.  You throw up (vomit) and your throw-up looks like blood or coffee grounds.  You pass out (faint).  Your poop (stool) is bloody or black.  You cannot swallow, drink, or eat. Summary  If a person has gastroesophageal reflux disease (GERD), food and stomach acid move back up into the esophagus and cause symptoms or problems such as damage to the esophagus.  Treatment will depend on how bad your symptoms are.  Follow a diet as told by your doctor.  Take all medicines only as told by your doctor. This information is not intended to replace advice given to you by your health care provider. Make sure you discuss any questions you have with your health care provider. Document Released: 01/23/2008 Document Revised: 02/12/2018 Document Reviewed: 02/12/2018 Elsevier Patient Education  2020 [employer] Food Choices for Gastroesophageal Reflux Disease, Adult When you have gastroesophageal reflux disease (GERD), the foods you eat and your eating habits are very important. Choosing the right foods can help ease your discomfort. Think about working with a nutrition specialist (dietitian) to help you make good choices. What are tips for following this plan?  Meals  Choose healthy foods that are low in fat, such as fruits, vegetables, whole grains,  low-fat dairy products, and lean meat, fish, and poultry.  Eat small meals often instead of 3 large meals a day. Eat your meals slowly, and in a place where you are relaxed. Avoid bending over or lying down until 2-3 hours after eating.  Avoid eating meals 2-3 hours before bed.  Avoid drinking a lot of liquid with meals.  Cook foods using methods other than frying. Bake, grill, or broil food instead.  Avoid or limit: ? Chocolate. ? Peppermint or spearmint. ? Alcohol. ? Pepper. ? Black and decaffeinated coffee. ? Black and decaffeinated tea. ? Bubbly (carbonated) soft drinks. ? Caffeinated energy drinks and soft drinks.  Limit high-fat foods such as: ? Fatty meat or fried foods. ? Whole milk, cream, butter, or ice cream. ? Nuts and nut butters. ? Pastries, donuts, and sweets made with butter or shortening.  Avoid foods that cause symptoms. These foods may be different for everyone. Common foods that cause symptoms include: ? Tomatoes. ? Oranges, lemons, and limes. ? Peppers. ? Spicy food. ? Onions and garlic. ? Vinegar. Lifestyle  Maintain a healthy weight. Ask your doctor what weight is healthy for you.   If you need to lose weight, work with your doctor to do so safely.  Exercise for at least 30 minutes for 5 or more days each week, or as told by your doctor.  Wear loose-fitting clothes.  Do not smoke. If you need help quitting, ask your doctor.  Sleep with the head of your bed higher than your feet. Use a wedge under the mattress or blocks under the bed frame to raise the head of the bed. Summary  When you have gastroesophageal reflux disease (GERD), food and lifestyle choices are very important in easing your symptoms.  Eat small meals often instead of 3 large meals a day. Eat your meals slowly, and in a place where you are relaxed.  Limit high-fat foods such as fatty meat or fried foods.  Avoid bending over or lying down until 2-3 hours after eating.  Avoid  peppermint and spearmint, caffeine, alcohol, and chocolate. This information is not intended to replace advice given to you by your health care provider. Make sure you discuss any questions you have with your health care provider. Document Released: 02/05/2012 Document Revised: 11/27/2018 Document Reviewed: 09/11/2016 Elsevier Patient Education  2020 [employer] Inc.  

## 2019-06-10 NOTE — Progress Notes (Signed)
Patient: Valerie West Female    DOB: 1986-01-29   33 y.o.   MRN: [id number] Visit Date: 06/10/2019  Today's Provider: Mar Daring, PA-C   No chief complaint on file.  Subjective:     Virtual Visit via Video Note  I connected with Valerie West on 06/10/19 at  2:40 PM EDT by a video enabled telemedicine application and verified that I am speaking with the correct person using two identifiers.  Location: Patient: Work Provider: [facility]   I discussed the limitations of evaluation and management by telemedicine and the availability of in person appointments. The patient expressed understanding and agreed to proceed.  HPI Gastroesophageal reflux disease: 2 weeks follow up. Dexilant was restarted.  She reports that symptoms have not changed much. She does report the pain is not so much in her chest any longer but is now localized to the top of the stomach, epigastric region. She also reports the metallic taste is not as frequent, but still present. She had something similar in 06/2018 but it responded more quickly to dexilant.   No Known Allergies   Current Outpatient Medications:  .  ALPRAZolam (XANAX) 0.25 MG tablet, TAKE ONE TABLET DAILY BY MOUTH AS NEEDED FOR ANXIETY, Disp: 90 tablet, Rfl: 1 .  citalopram (CELEXA) 40 MG tablet, TAKE 1 TABLET BY MOUTH EVERY DAY, Disp: 90 tablet, Rfl: 1 .  dexlansoprazole (DEXILANT) 60 MG capsule, Take 1 capsule (60 mg total) by mouth daily., Disp: 90 capsule, Rfl: 0 .  ERRIN 0.35 MG tablet, Take 1 tablet by mouth daily., Disp: , Rfl: 4 .  Melatonin-Pyridoxine ([employer] MELATONIN) 5-10 MG TBCR, Take by mouth., Disp: , Rfl:  .  Multiple Vitamin (MULTIVITAMIN) capsule, Take by mouth., Disp: , Rfl:  .  rizatriptan (MAXALT) 10 MG tablet, , Disp: , Rfl:   Review of Systems  Constitutional: Negative.   Respiratory: Negative.   Cardiovascular: Negative.   Gastrointestinal: Positive for abdominal distention, abdominal pain and nausea. Negative  for constipation, diarrhea and vomiting.  Neurological: Negative.     Social History   Tobacco Use  . Smoking status: Never Smoker  . Smokeless tobacco: Never Used  Substance Use Topics  . Alcohol use: Yes    Alcohol/week: 0.0 standard drinks    Comment: occassional      Objective:   There were no vitals taken for this visit. There were no vitals filed for this visit.There is no height or weight on file to calculate BMI.   Physical Exam Vitals signs reviewed.  Constitutional:      General: She is not in acute distress.    Appearance: Normal appearance. She is well-developed and normal weight. She is not ill-appearing.  HENT:     Head: Normocephalic and atraumatic.  Neck:     Musculoskeletal: Normal range of motion and neck supple.  Pulmonary:     Effort: Pulmonary effort is normal. No respiratory distress.  Neurological:     Mental Status: She is alert.  Psychiatric:        Mood and Affect: Mood normal.        Behavior: Behavior normal.        Thought Content: Thought content normal.        Judgment: Judgment normal.      No results found for any visits on 06/11/19.     Assessment & Plan    1. Gastroesophageal reflux disease, unspecified whether esophagitis present Not responding completely to dexilant,  but has had some symptom improvement (not complete resolution). Will refer to GI for further evaluation since this is the 2nd occurrence in less than a year. Since we have only done mychart visits we have been unable to r/o h.pylori. Will refer to GI for consideration of an EGD for further evaluation. Continue Dexilant for now.  - Ambulatory referral to Gastroenterology  2. Epigastric pain See above medical treatment plan. - Ambulatory referral to Gastroenterology  3. H/O benign carcinoid tumor Followed by [facility], Dr. Hervey Ard.  - Ambulatory referral to Gastroenterology  I discussed the assessment and treatment plan with the patient.  The patient was provided an opportunity to ask questions and all were answered. The patient agreed with the plan and demonstrated an understanding of the instructions.   The patient was advised to call back or seek an in-person evaluation if the symptoms worsen or if the condition fails to improve as anticipated.  I provided 12 minutes of non-face-to-face time during this encounter.    Mar Daring, PA-C  [facility]

## 2019-06-11 ENCOUNTER — Telehealth (INDEPENDENT_AMBULATORY_CARE_PROVIDER_SITE_OTHER): Payer: BC Managed Care – PPO | Admitting: Physician Assistant

## 2019-06-11 ENCOUNTER — Encounter: Payer: Self-pay | Admitting: Physician Assistant

## 2019-06-11 DIAGNOSIS — Z86012 Personal history of benign carcinoid tumor: Secondary | ICD-10-CM | POA: Diagnosis not present

## 2019-06-11 DIAGNOSIS — K219 Gastro-esophageal reflux disease without esophagitis: Secondary | ICD-10-CM

## 2019-06-11 DIAGNOSIS — R1013 Epigastric pain: Secondary | ICD-10-CM

## 2019-06-12 ENCOUNTER — Encounter: Payer: Self-pay | Admitting: Physician Assistant

## 2019-06-12 DIAGNOSIS — K279 Peptic ulcer, site unspecified, unspecified as acute or chronic, without hemorrhage or perforation: Secondary | ICD-10-CM

## 2019-06-12 MED ORDER — SUCRALFATE 1 G PO TABS: 1.0000 g | ORAL_TABLET | Freq: Three times a day (TID) | ORAL | 0 refills | Status: DC

## 2019-06-12 NOTE — Telephone Encounter (Signed)
I have sent in Sucralfate. This medication has to be taken 4 times daily (with meals and at bedtime).

## 2019-06-12 NOTE — Telephone Encounter (Signed)
Pt advised.  She is wanting to know if it is okay to take dexalant along with Sucralfate?  Thanks,   -Mickel Baas

## 2019-06-12 NOTE — Telephone Encounter (Signed)
Pt called back to report Dexalant is not helping with her abdominal pain.  She is wanting to know if there is something else she can try to get her through until her GI appointment.  Contact number:  [phone]   Thanks,   -Mickel Baas

## 2019-06-18 ENCOUNTER — Other Ambulatory Visit: Payer: Self-pay

## 2019-06-18 ENCOUNTER — Ambulatory Visit: Payer: BC Managed Care – PPO | Admitting: Gastroenterology

## 2019-06-18 ENCOUNTER — Encounter: Payer: Self-pay | Admitting: Gastroenterology

## 2019-06-18 VITALS — BP 111/74 | HR 74 | Temp 97.8°F | Ht 63.0 in | Wt 139.8 lb

## 2019-06-18 DIAGNOSIS — R1013 Epigastric pain: Secondary | ICD-10-CM | POA: Diagnosis not present

## 2019-06-18 DIAGNOSIS — Z791 Long term (current) use of non-steroidal anti-inflammatories (NSAID): Secondary | ICD-10-CM

## 2019-06-18 DIAGNOSIS — G8929 Other chronic pain: Secondary | ICD-10-CM

## 2019-06-18 NOTE — Progress Notes (Signed)
Jonathon Bellows MD, MRCP(U.K) [address]  [place]  [place], [place] [address]  Main: [phone]  Fax: [phone]   Gastroenterology Consultation  Referring Provider:     Florian Buff* Primary Care Physician:  Mar Daring, PA-C Primary Gastroenterologist:  Dr. Jonathon Bellows  Reason for Consultation:     GERD        HPI:   Valerie West is a 33 y.o. y/o female referred for consultation & management  by Dr. Marlyn Corporal, Clearnce Sorrel, PA-C.     She was seen by Mar Daring, PA-C on 06/11/2019 for acid reflux.  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  CT scan of the chest on 12/30/2018 shows no thoracic tumor recurrence.  She says that she has had epigastric discomfort for over a year.  Has had about 5 episodes each episode lasting more than a few days.  Described as severe discomfort like a squeezing the epigastrium nonradiating aggravated with eating foods which occurs right after eating any food.  No clear relieving factors.  Treated with Dexilant which helped partially subsequently addition of Carafate made her feel much better.  She has been taking Excedrin on a daily basis since second September for headaches.  Prior to which was taking it at least 1 or 2 times per week for many years.  Denies any weight loss. metallic taste in her mouth which she says occurred prior to diagnosis of her carcinoid tumor.  Past Medical History:  Diagnosis Date  . Anxiety   . Depression during pregnancy, antepartum unk    Past Surgical History:  Procedure Laterality Date  . LAPAROSCOPIC APPENDECTOMY N/A 06/23/2016   Procedure: APPENDECTOMY LAPAROSCOPIC;  Surgeon: Clayburn Pert, MD;  Location: [facility] ORS;  Service: General;  Laterality: N/A;  . LOBECTOMY Left 12/07/2016    Prior to Admission medications   Medication Sig Start Date End Date Taking? Authorizing Provider  ALPRAZolam Duanne Moron) 0.25 MG tablet  TAKE ONE TABLET DAILY BY MOUTH AS NEEDED FOR ANXIETY 09/26/18   Mar Daring, PA-C  citalopram (CELEXA) 40 MG tablet TAKE 1 TABLET BY MOUTH EVERY DAY 04/06/19   Mar Daring, PA-C  dexlansoprazole (DEXILANT) 60 MG capsule Take 1 capsule (60 mg total) by mouth daily. 05/28/19   Mar Daring, PA-C  ERRIN 0.35 MG tablet Take 1 tablet by mouth daily. 04/16/18   [provider]  Melatonin-Pyridoxine ([employer] MELATONIN) 5-10 MG TBCR Take by mouth.    [provider]  Multiple Vitamin (MULTIVITAMIN) capsule Take by mouth.    [provider]  rizatriptan (MAXALT) 10 MG tablet  05/01/16   [provider]  sucralfate (CARAFATE) 1 g tablet Take 1 tablet (1 g total) by mouth 4 (four) times daily -  with meals and at bedtime. 06/12/19   Mar Daring, PA-C    Family History  Problem Relation Age of Onset  . Depression Father   . Bipolar disorder Father   . Alcohol abuse Father   . Drug abuse Father   . Cancer Maternal Grandmother   . Diabetes Maternal Grandmother   . Diabetes Maternal Grandfather   . Cancer Maternal Grandfather   . Hypertension Mother      Social History   Tobacco Use  . Smoking status: Never Smoker  . Smokeless tobacco: Never Used  Substance Use Topics  . Alcohol use: Yes    Alcohol/week: 0.0  standard drinks    Comment: occassional  . Drug use: No    Allergies as of 06/18/2019  . (No Known Allergies)    Review of Systems:    All systems reviewed and negative except where noted in HPI.   Physical Exam:  There were no vitals taken for this visit. No LMP recorded. Psych:  Alert and cooperative. Normal mood and affect. General:   Alert,  Well-developed, well-nourished, pleasant and cooperative in NAD Head:  Normocephalic and atraumatic. Eyes:  Sclera clear, no icterus.   Conjunctiva pink. Ears:  Normal auditory acuity. Nose:  No deformity, discharge, or lesions. Mouth:  No deformity or lesions,oropharynx pink  & moist. Neck:  Supple; no masses or thyromegaly. Lungs:  Respirations even and unlabored.  Clear throughout to auscultation.   No wheezes, crackles, or rhonchi. No acute distress. Heart:  Regular rate and rhythm; no murmurs, clicks, rubs, or gallops. Abdomen:  Normal bowel sounds.  No bruits.  Soft, non-tender and non-distended without masses, hepatosplenomegaly or hernias noted.  No guarding or rebound tenderness.    Neurologic:  Alert and oriented x3;  grossly normal neurologically. Skin:  Intact without significant lesions or rashes. No jaundice. Lymph Nodes:  No significant cervical adenopathy. Psych:  Alert and cooperative. Normal mood and affect.  Imaging Studies: No results found.  Assessment and Plan:   Valerie West is a 33 y.o. y/o female has been referred for chronic abdominal pain in the epigastric area.  Long-term use of NSAIDs.  It is very likely that she possibly has a gastric ulcer secondary to NSAID use.  Since has been going on for more than a year I suggest we proceed with upper endoscopy.  In addition I have advised her to completely stop taking any NSAIDs and continue her PPI therapy.  If no better in 6 weeks time and provided the EEG is negative no further evaluation will be needed but if the pain continues I would suggest we proceed with CT scan of the abdomen and pelvis at that point of time.  I will also check her for H. pylori at this point of time in our office.  I have discussed alternative options, risks & benefits,  which include, but are not limited to, bleeding, infection, perforation,respiratory complication & drug reaction.  The patient agrees with this plan & written consent will be obtained.   ]  Follow up in 6 weeks  Dr Jonathon Bellows MD,MRCP(U.K)

## 2019-06-20 LAB — H. PYLORI BREATH TEST: H pylori Breath Test: NEGATIVE

## 2019-06-22 ENCOUNTER — Encounter: Payer: Self-pay | Admitting: Gastroenterology

## 2019-06-29 ENCOUNTER — Other Ambulatory Visit: Payer: Self-pay

## 2019-06-29 ENCOUNTER — Other Ambulatory Visit
Admission: RE | Admit: 2019-06-29 | Discharge: 2019-06-29 | Disposition: A | Discharge status: Home / Self Care | Payer: BC Managed Care – PPO | Source: Ambulatory Visit | Attending: Gastroenterology | Admitting: Gastroenterology

## 2019-06-29 DIAGNOSIS — Z01812 Encounter for preprocedural laboratory examination: Secondary | ICD-10-CM | POA: Diagnosis not present

## 2019-06-29 DIAGNOSIS — Z20828 Contact with and (suspected) exposure to other viral communicable diseases: Secondary | ICD-10-CM | POA: Diagnosis not present

## 2019-06-29 LAB — SARS CORONAVIRUS 2 (TAT 6-24 HRS): SARS Coronavirus 2: NEGATIVE

## 2019-06-30 NOTE — Telephone Encounter (Signed)
Yes we should be able to give her some meds , if she reminds me on that day would be great .

## 2019-07-01 ENCOUNTER — Encounter: Payer: Self-pay | Admitting: Emergency Medicine

## 2019-07-02 ENCOUNTER — Ambulatory Visit: Payer: BC Managed Care – PPO | Admitting: Registered Nurse

## 2019-07-02 ENCOUNTER — Encounter
Admission: RE | Disposition: A | Discharge status: Home / Self Care | Payer: Self-pay | Source: Home / Self Care | Attending: Gastroenterology

## 2019-07-02 ENCOUNTER — Encounter: Payer: Self-pay | Admitting: Anesthesiology

## 2019-07-02 ENCOUNTER — Ambulatory Visit
Admission: RE | Admit: 2019-07-02 | Discharge: 2019-07-02 | Disposition: A | Discharge status: Home / Self Care | Payer: BC Managed Care – PPO | Attending: Gastroenterology | Admitting: Gastroenterology

## 2019-07-02 DIAGNOSIS — Z79899 Other long term (current) drug therapy: Secondary | ICD-10-CM | POA: Diagnosis not present

## 2019-07-02 DIAGNOSIS — R109 Unspecified abdominal pain: Secondary | ICD-10-CM | POA: Insufficient documentation

## 2019-07-02 DIAGNOSIS — F419 Anxiety disorder, unspecified: Secondary | ICD-10-CM | POA: Diagnosis not present

## 2019-07-02 DIAGNOSIS — G8929 Other chronic pain: Secondary | ICD-10-CM

## 2019-07-02 DIAGNOSIS — R1013 Epigastric pain: Secondary | ICD-10-CM | POA: Diagnosis not present

## 2019-07-02 DIAGNOSIS — K2970 Gastritis, unspecified, without bleeding: Secondary | ICD-10-CM | POA: Insufficient documentation

## 2019-07-02 HISTORY — DX: Nausea with vomiting, unspecified: R11.2

## 2019-07-02 HISTORY — DX: Other complications of anesthesia, initial encounter: T88.59XA

## 2019-07-02 HISTORY — PX: ESOPHAGOGASTRODUODENOSCOPY (EGD) WITH PROPOFOL: SHX5813

## 2019-07-02 HISTORY — DX: Other specified postprocedural states: Z98.890

## 2019-07-02 LAB — POCT PREGNANCY, URINE: Preg Test, Ur: NEGATIVE

## 2019-07-02 SURGERY — ESOPHAGOGASTRODUODENOSCOPY (EGD) WITH PROPOFOL
Anesthesia: General

## 2019-07-02 MED ORDER — ONDANSETRON HCL 4 MG/2ML IJ SOLN
INTRAMUSCULAR | Status: DC | PRN
  Administered 2019-07-02: 4 mg via INTRAVENOUS

## 2019-07-02 MED ORDER — MIDAZOLAM HCL 2 MG/2ML IJ SOLN
INTRAMUSCULAR | Status: DC | PRN
  Administered 2019-07-02: 1 mg via INTRAVENOUS

## 2019-07-02 MED ORDER — ONDANSETRON HCL 4 MG/2ML IJ SOLN
INTRAMUSCULAR | Status: AC
  Filled 2019-07-02: qty 2

## 2019-07-02 MED ORDER — LIDOCAINE HCL (PF) 2 % IJ SOLN
INTRAMUSCULAR | Status: AC
  Filled 2019-07-02: qty 10

## 2019-07-02 MED ORDER — PROPOFOL 500 MG/50ML IV EMUL
INTRAVENOUS | Status: AC
  Filled 2019-07-02: qty 50

## 2019-07-02 MED ORDER — PROPOFOL 10 MG/ML IV BOLUS
INTRAVENOUS | Status: DC | PRN
  Administered 2019-07-02: 30 mg via INTRAVENOUS
  Administered 2019-07-02: 20 mg via INTRAVENOUS
  Administered 2019-07-02: 30 mg via INTRAVENOUS
  Administered 2019-07-02: 70 mg via INTRAVENOUS
  Administered 2019-07-02: 20 mg via INTRAVENOUS

## 2019-07-02 MED ORDER — MIDAZOLAM HCL 2 MG/2ML IJ SOLN
INTRAMUSCULAR | Status: AC
  Filled 2019-07-02: qty 2

## 2019-07-02 MED ORDER — GLYCOPYRROLATE 0.2 MG/ML IJ SOLN
INTRAMUSCULAR | Status: AC
  Filled 2019-07-02: qty 1

## 2019-07-02 MED ORDER — SODIUM CHLORIDE 0.9 % IV SOLN
INTRAVENOUS | Status: DC
  Administered 2019-07-02: 10:00:00 via INTRAVENOUS

## 2019-07-02 NOTE — Op Note (Signed)
[facility] Gastroenterology Patient Name: Valerie West Procedure Date: 07/02/2019 10:24 AM MRN: [id number] Account #: [id number] Date of Birth: 04-13-1986 Admit Type: Outpatient Age: 33 Room: [facility] ENDO ROOM 4 Gender: Female Note Status: Finalized Procedure:             Upper GI endoscopy Indications:           Abdominal pain Providers:             Jonathon Bellows MD, MD Referring MD:          Mar Daring (Referring MD) Medicines:             Monitored Anesthesia Care Complications:         No immediate complications. Procedure:             Pre-Anesthesia Assessment:                        - ASA Grade Assessment: II - A patient with mild                         systemic disease.                        After obtaining informed consent, the endoscope was                         passed under direct vision. Throughout the procedure,                         the patient's blood pressure, pulse, and oxygen                         saturations were monitored continuously. The Endoscope                         was introduced through the mouth, and advanced to the                         third part of duodenum. The upper GI endoscopy was                         accomplished with ease. The patient tolerated the                         procedure well. Findings:      The esophagus was normal.      The examined duodenum was normal.      Patchy mild inflammation characterized by congestion (edema) and       erythema was found on the greater curvature of the stomach. Biopsies       were taken with a cold forceps for histology.      The cardia and gastric fundus were normal on retroflexion. Impression:            - Normal esophagus.                        - Normal examined duodenum.                        - Gastritis. Biopsied. Recommendation:        -  Await pathology results.                        - Discharge patient to home (with escort).                        -  Resume previous diet.                        - Continue present medications.                        - Return to my office in 6 weeks. Procedure Code(s):     --- Professional ---                        [phone], Esophagogastroduodenoscopy, flexible,                         transoral; with biopsy, single or multiple Diagnosis Code(s):     --- Professional ---                        K29.70, Gastritis, unspecified, without bleeding                        R10.9, Unspecified abdominal pain CPT copyright 2019 American Medical Association. All rights reserved. The codes documented in this report are preliminary and upon coder review may  be revised to meet current compliance requirements. Jonathon Bellows, MD Jonathon Bellows MD, MD 07/02/2019 10:35:18 AM This report has been signed electronically. Number of Addenda: 0 Note Initiated On: 07/02/2019 10:24 AM Estimated Blood Loss:  Estimated blood loss: none.      [facility]

## 2019-07-02 NOTE — Transfer of Care (Signed)
Immediate Anesthesia Transfer of Care Note  Patient: Valerie West  Procedure(s) Performed: ESOPHAGOGASTRODUODENOSCOPY (EGD) WITH PROPOFOL (N/A )  Patient Location: PACU  Anesthesia Type:General  Level of Consciousness: awake, alert  and oriented  Airway & Oxygen Therapy: Patient Spontanous Breathing  Post-op Assessment: Report given to RN and Post -op Vital signs reviewed and stable  Post vital signs: Reviewed and stable  Last Vitals:  Vitals Value Taken Time  BP 106/69 07/02/19 1038  Temp 36.6 C 07/02/19 1038  Pulse 109 07/02/19 1038  Resp 18 07/02/19 1038  SpO2 93 % 07/02/19 1038    Last Pain:  Vitals:   07/02/19 1036  TempSrc: Tympanic  PainSc: Asleep         Complications: No apparent anesthesia complications

## 2019-07-02 NOTE — Anesthesia Preprocedure Evaluation (Signed)
Anesthesia Evaluation  Patient identified by MRN, date of birth, ID band Patient awake    Reviewed: Allergy & Precautions, NPO status , Patient's Chart, lab work & pertinent test results  History of Anesthesia Complications Negative for: history of anesthetic complications  Airway Mallampati: II       Dental   Pulmonary neg pulmonary ROS,    Pulmonary exam normal        Cardiovascular negative cardio ROS Normal cardiovascular exam     Neuro/Psych  Headaches, PSYCHIATRIC DISORDERS Anxiety Depression    GI/Hepatic negative GI ROS, Neg liver ROS,   Endo/Other  negative endocrine ROS  Renal/GU negative Renal ROS     Musculoskeletal   Abdominal Normal abdominal exam  (+)   Peds  Hematology negative hematology ROS (+)   Anesthesia Other Findings   Reproductive/Obstetrics                             Anesthesia Physical  Anesthesia Plan  ASA: II and emergent  Anesthesia Plan: General   Post-op Pain Management:    Induction: Intravenous  PONV Risk Score and Plan:   Airway Management Planned: Nasal Cannula  Additional Equipment:   Intra-op Plan:   Post-operative Plan:   Informed Consent: I have reviewed the patients History and Physical, chart, labs and discussed the procedure including the risks, benefits and alternatives for the proposed anesthesia with the patient or authorized representative who has indicated his/her understanding and acceptance.     Dental advisory given  Plan Discussed with: CRNA and Surgeon  Anesthesia Plan Comments:         Anesthesia Quick Evaluation

## 2019-07-02 NOTE — H&P (Signed)
Jonathon Bellows, MD [address], [place], [place], [place], [address] [address], [place], [place], [place], [address] Phone: [phone]  Fax: [phone]  Primary Care Physician:  Mar Daring, PA-C   Pre-Procedure History & Physical: HPI:  Valerie West is a 33 y.o. female is here for an endoscopy    Past Medical History:  Diagnosis Date  . Anxiety   . Depression during pregnancy, antepartum unk    Past Surgical History:  Procedure Laterality Date  . LAPAROSCOPIC APPENDECTOMY N/A 06/23/2016   Procedure: APPENDECTOMY LAPAROSCOPIC;  Surgeon: Clayburn Pert, MD;  Location: [facility] ORS;  Service: General;  Laterality: N/A;  . LOBECTOMY Left 12/07/2016    Prior to Admission medications   Medication Sig Start Date End Date Taking? Authorizing Provider  ALPRAZolam Duanne Moron) 0.25 MG tablet TAKE ONE TABLET DAILY BY MOUTH AS NEEDED FOR ANXIETY 09/26/18   Mar Daring, PA-C  citalopram (CELEXA) 40 MG tablet TAKE 1 TABLET BY MOUTH EVERY DAY 04/06/19   Mar Daring, PA-C  dexlansoprazole (DEXILANT) 60 MG capsule Take 1 capsule (60 mg total) by mouth daily. 05/28/19   Mar Daring, PA-C  ERRIN 0.35 MG tablet Take 1 tablet by mouth daily. 04/16/18   [provider]  Melatonin-Pyridoxine ([employer]S MELATONIN) 5-10 MG TBCR Take by mouth.    [provider]  Multiple Vitamin (MULTIVITAMIN) capsule Take by mouth.    [provider]  rizatriptan (MAXALT) 10 MG tablet  05/01/16   [provider]  sucralfate (CARAFATE) 1 g tablet Take 1 tablet (1 g total) by mouth 4 (four) times daily -  with meals and at bedtime. 06/12/19   Mar Daring, PA-C    Allergies as of 06/19/2019  . (No Known Allergies)    Family History  Problem Relation Age of Onset  . Depression Father   . Bipolar disorder Father   . Alcohol abuse Father   . Drug abuse Father   . Cancer Maternal Grandmother   . Diabetes Maternal Grandmother   . Diabetes  Maternal Grandfather   . Cancer Maternal Grandfather   . Hypertension Mother     Social History   Socioeconomic History  . Marital status: Married    Spouse name: Louie Casa  . Number of children: 2  . Years of education: 64  . Highest education level: Not on file  Occupational History  . Occupation: Product manager: [employer]  Social Needs  . Financial resource strain: Not on file  . Food insecurity    Worry: Not on file    Inability: Not on file  . Transportation needs    Medical: Not on file    Non-medical: Not on file  Tobacco Use  . Smoking status: Never Smoker  . Smokeless tobacco: Never Used  Substance and Sexual Activity  . Alcohol use: Yes    Alcohol/week: 0.0 standard drinks    Comment: occassional  . Drug use: No  . Sexual activity: Not on file  Lifestyle  . Physical activity    Days per week: Not on file    Minutes per session: Not on file  . Stress: Not on file  Relationships  . Social Herbalist on phone: Not on file    Gets together: Not on file    Attends religious service: Not on file    Active member of club or organization: Not on file    Attends meetings of clubs or  organizations: Not on file    Relationship status: Not on file  . Intimate partner violence    Fear of current or ex partner: Not on file    Emotionally abused: Not on file    Physically abused: Not on file    Forced sexual activity: Not on file  Other Topics Concern  . Not on file  Social History Narrative  . Not on file    Review of Systems: See HPI, otherwise negative ROS  Physical Exam: There were no vitals taken for this visit. General:   Alert,  pleasant and cooperative in NAD Head:  Normocephalic and atraumatic. Neck:  Supple; no masses or thyromegaly. Lungs:  Clear throughout to auscultation, normal respiratory effort.    Heart:  +S1, +S2, Regular rate and rhythm, No edema. Abdomen:  Soft, nontender and nondistended. Normal bowel sounds, without guarding,  and without rebound.   Neurologic:  Alert and  oriented x4;  grossly normal neurologically.  Impression/Plan: Valerie West is here for an endoscopy  to be performed for  evaluation of abdominal pain     Risks, benefits, limitations, and alternatives regarding endoscopy have been reviewed with the patient.  Questions have been answered.  All parties agreeable.   Jonathon Bellows, MD  07/02/2019, 9:59 AM

## 2019-07-02 NOTE — Anesthesia Post-op Follow-up Note (Signed)
Anesthesia QCDR form completed.        

## 2019-07-03 ENCOUNTER — Encounter: Payer: Self-pay | Admitting: Gastroenterology

## 2019-07-05 ENCOUNTER — Other Ambulatory Visit: Payer: Self-pay | Admitting: Physician Assistant

## 2019-07-05 DIAGNOSIS — K279 Peptic ulcer, site unspecified, unspecified as acute or chronic, without hemorrhage or perforation: Secondary | ICD-10-CM

## 2019-07-06 LAB — SURGICAL PATHOLOGY

## 2019-07-06 NOTE — Anesthesia Postprocedure Evaluation (Signed)
Anesthesia Post Note  Patient: Lauren L Rosendahl  Procedure(s) Performed: ESOPHAGOGASTRODUODENOSCOPY (EGD) WITH PROPOFOL (N/A )  Patient location during evaluation: Endoscopy Anesthesia Type: General Level of consciousness: awake and alert and oriented Pain management: pain level controlled Vital Signs Assessment: post-procedure vital signs reviewed and stable Respiratory status: spontaneous breathing Cardiovascular status: blood pressure returned to baseline Anesthetic complications: no     Last Vitals:  Vitals:   07/02/19 1046 07/02/19 1106  BP: 94/68 105/67  Pulse:    Resp:    Temp:    SpO2:      Last Pain:  Vitals:   07/03/19 0739  TempSrc:   PainSc: 0-No pain                 Quantel Mcinturff

## 2019-07-10 ENCOUNTER — Encounter: Payer: BC Managed Care – PPO | Admitting: Physician Assistant

## 2019-07-12 ENCOUNTER — Encounter: Payer: Self-pay | Admitting: Gastroenterology

## 2019-07-13 NOTE — Telephone Encounter (Signed)
Jadijah   Please inform patient that since the pain is continuing I would suggest we proceed with CT scan of the abdomen and pelvis.  This is as per our plan we discussed at the office visit she had prior to her endoscopy.  Dr Jonathon Bellows MD,MRCP [facility]) Gastroenterology/Hepatology Pager: [phone]

## 2019-07-14 ENCOUNTER — Other Ambulatory Visit: Payer: Self-pay

## 2019-07-14 DIAGNOSIS — R1013 Epigastric pain: Secondary | ICD-10-CM

## 2019-07-15 NOTE — Telephone Encounter (Signed)
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 Avoid NSAID's is the solution

## 2019-07-22 ENCOUNTER — Encounter: Payer: BC Managed Care – PPO | Admitting: Physician Assistant

## 2019-07-22 NOTE — Progress Notes (Signed)
Patient: Valerie West, Female    DOB: January 21, 1986, 33 y.o.   MRN: [id number] Visit Date: 07/24/2019  Today's Provider: Mar Daring, PA-C   Chief Complaint  Patient presents with  . Annual Exam   Subjective:    I,Valerie West,RMA am acting as a Education administrator for [employer], PA-C.    Annual physical exam Valerie West is a 33 y.o. female who presents today for health maintenance and complete physical. She feels well. She reports exercising. She reports she is sleeping well. ----------------------------------------------------------------- She is going to have a CT for her abdominal pain on 07/28/19. Followed by Dr. Vicente Males, GI. Suspected NSAID induced gastritis from excedrin migraine medications. Pap: Due January at [facility]  Review of Systems  Constitutional: Negative.   HENT: Negative.   Eyes: Negative.   Respiratory: Negative.   Cardiovascular: Negative.   Gastrointestinal: Positive for abdominal pain.  Endocrine: Negative.   Genitourinary: Negative.   Musculoskeletal: Negative.   Skin: Negative.   Allergic/Immunologic: Negative.   Neurological: Positive for headaches.  Hematological: Negative.   Psychiatric/Behavioral: The patient is nervous/anxious.     Social History      She  reports that she has never smoked. She has never used smokeless tobacco. She reports current alcohol use. She reports that she does not use drugs.       Social History   Socioeconomic History  . Marital status: Married    Spouse name: Louie Casa  . Number of children: 2  . Years of education: 2  . Highest education level: Not on file  Occupational History  . Occupation: Product manager: [employer]  Social Needs  . Financial resource strain: Not on file  . Food insecurity    Worry: Not on file    Inability: Not on file  . Transportation needs    Medical: Not on file    Non-medical: Not on file  Tobacco Use  . Smoking status: Never Smoker  . Smokeless  tobacco: Never Used  Substance and Sexual Activity  . Alcohol use: Yes    Alcohol/week: 0.0 standard drinks    Comment: occassional  . Drug use: No  . Sexual activity: Not on file  Lifestyle  . Physical activity    Days per week: Not on file    Minutes per session: Not on file  . Stress: Not on file  Relationships  . Social Herbalist on phone: Not on file    Gets together: Not on file    Attends religious service: Not on file    Active member of club or organization: Not on file    Attends meetings of clubs or organizations: Not on file    Relationship status: Not on file  Other Topics Concern  . Not on file  Social History Narrative  . Not on file    Past Medical History:  Diagnosis Date  . Anxiety   . Complication of anesthesia   . Depression during pregnancy, antepartum unk  . PONV (postoperative nausea and vomiting)      Patient Active Problem List   Diagnosis Date Noted  . 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 S/P laparoscopic appendectomy 06/17/2017  . 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 Vitamin B12 deficiency 03/15/2015  . Migraine 02/14/2015  . Anxiety 01/27/2015  . Myofascial pain 01/27/2015  . Cervical muscle strain 01/27/2015  . Depression,  postpartum 10/26/2013    Past Surgical History:  Procedure Laterality Date  . ESOPHAGOGASTRODUODENOSCOPY (EGD) WITH PROPOFOL N/A 07/02/2019   Procedure: ESOPHAGOGASTRODUODENOSCOPY (EGD) WITH PROPOFOL;  Surgeon: Jonathon Bellows, MD;  Location: [facility] ENDOSCOPY;  Service: Gastroenterology;  Laterality: N/A;  . LAPAROSCOPIC APPENDECTOMY N/A 06/23/2016   Procedure: APPENDECTOMY LAPAROSCOPIC;  Surgeon: Clayburn Pert, MD;  Location: [facility] ORS;  Service: General;  Laterality: N/A;  . LOBECTOMY Left 12/07/2016    Family History        Family Status  Relation Name Status  . Father  Alive  . MGM  Alive  . MGF  Alive  . Mother  Alive        Her family history includes  Alcohol abuse in her father; Bipolar disorder in her father; Cancer in her maternal grandfather and maternal grandmother; Depression in her father; Diabetes in her maternal grandfather and maternal grandmother; Drug abuse in her father; Hypertension in her mother.      No Known Allergies   Current Outpatient Medications:  .  ALPRAZolam (XANAX) 0.25 MG tablet, TAKE ONE TABLET DAILY BY MOUTH AS NEEDED FOR ANXIETY, Disp: 90 tablet, Rfl: 1 .  citalopram (CELEXA) 40 MG tablet, TAKE 1 TABLET BY MOUTH EVERY DAY, Disp: 90 tablet, Rfl: 1 .  dexlansoprazole (DEXILANT) 60 MG capsule, Take 1 capsule (60 mg total) by mouth daily., Disp: 90 capsule, Rfl: 0 .  Multiple Vitamin (MULTIVITAMIN) capsule, Take by mouth., Disp: , Rfl:  .  rizatriptan (MAXALT) 10 MG tablet, , Disp: , Rfl:  .  sucralfate (CARAFATE) 1 g tablet, TAKE 1 TABLET (1 G TOTAL) BY MOUTH 4 (FOUR) TIMES DAILY - WITH MEALS AND AT BEDTIME. (Patient not taking: Reported on 07/24/2019), Disp: 120 tablet, Rfl: 0   Patient Care Team: Rubye Beach as PCP - General (Physician Assistant)    Objective:    Vitals: BP 106/74 (BP Location: Left Arm, Patient Position: Sitting, Cuff Size: Normal)   Pulse 76   Temp 97.8 F (36.6 C) (Temporal)   Resp 16   Ht 5\' 3"  (1.6 m)   Wt 139 lb 3.2 oz (63.1 kg)   BMI 24.66 kg/m    Vitals:   07/24/19 0914  BP: 106/74  Pulse: 76  Resp: 16  Temp: 97.8 F (36.6 C)  TempSrc: Temporal  Weight: 139 lb 3.2 oz (63.1 kg)  Height: 5\' 3"  (1.6 m)     Physical Exam Vitals signs reviewed.  Constitutional:      General: She is not in acute distress.    Appearance: Normal appearance. She is well-developed and normal weight. She is not ill-appearing or diaphoretic.  HENT:     Head: Normocephalic and atraumatic.     Right Ear: Tympanic membrane, ear canal and external ear normal.     Left Ear: Tympanic membrane, ear canal and external ear normal.     Nose: Nose normal.  Eyes:     General: No  scleral icterus.       Right eye: No discharge.        Left eye: No discharge.     Extraocular Movements: Extraocular movements intact.     Conjunctiva/sclera: Conjunctivae normal.     Pupils: Pupils are equal, round, and reactive to light.  Neck:     Musculoskeletal: Normal range of motion and neck supple.     Thyroid: No thyromegaly.     Vascular: No JVD.     Trachea: No tracheal deviation.  Cardiovascular:  Rate and Rhythm: Normal rate and regular rhythm.     Pulses: Normal pulses.     Heart sounds: Normal heart sounds. No murmur. No friction rub. No gallop.   Pulmonary:     Effort: Pulmonary effort is normal. No respiratory distress.     Breath sounds: Normal breath sounds. No wheezing or rales.  Chest:     Chest wall: No tenderness.  Abdominal:     General: Abdomen is flat. Bowel sounds are normal. There is no distension.     Palpations: Abdomen is soft. There is no mass.     Tenderness: There is no abdominal tenderness. There is no guarding or rebound.  Musculoskeletal: Normal range of motion.        General: No tenderness.     Right lower leg: No edema.     Left lower leg: No edema.  Lymphadenopathy:     Cervical: No cervical adenopathy.  Skin:    General: Skin is warm and dry.     Capillary Refill: Capillary refill takes less than 2 seconds.     Findings: No rash.  Neurological:     General: No focal deficit present.     Mental Status: She is alert and oriented to person, place, and time. Mental status is at baseline.  Psychiatric:        Mood and Affect: Mood normal.        Behavior: Behavior normal.        Thought Content: Thought content normal.        Judgment: Judgment normal.      Depression Screen PHQ 2/9 Scores 07/24/2019 07/07/2018 06/17/2017  PHQ - 2 Score 1 0 0  PHQ- 9 Score 3 3 7        Assessment & Plan:     Routine Health Maintenance and Physical Exam  Exercise Activities and Dietary recommendations Goals   None     Immunization  History  Administered Date(s) Administered  . Influenza,inj,Quad PF,6+ Mos 06/15/2015, 06/17/2017  . Influenza-Unspecified 05/28/2018  . Tdap 12/22/2013    Health Maintenance  Topic Date Due  . HIV Screening  04/17/1999  . PAP SMEAR-Modifier  09/20/2018  . INFLUENZA VACCINE  03/21/2019  . TETANUS/TDAP  12/23/2023     Discussed health benefits of physical activity, and encouraged her to engage in regular exercise appropriate for her age and condition.    1. Annual physical exam Normal physical exam today. Will check labs as below and f/u pending lab results. If labs are stable and WNL she will not need to have these rechecked for one year at her next annual physical exam. She is to call the office in the meantime if she has any acute issue, questions or concerns. - CBC w/Diff - Comprehensive Metabolic Panel (CMET) - Lipid Profile - TSH - HgB A1c  2. Vitamin B12 deficiency H/O this and on PPI now. Will check labs as below and f/u pending results. - B12  3. 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 Doing well. Occasional nerve pain along surgical scar, but not persistent or intense. Has f/u with Dr. Laverta Baltimore, [facility], in May 2021. On yearly f/u.   4. Encounter for lipid screening for cardiovascular disease Will check labs as below and f/u pending results. - Lipid Profile  5. Screening for diabetes mellitus Will check labs as below and f/u pending results. - HgB A1c  6. Screening for thyroid disorder Will check labs as below and f/u pending  results. - TSH  7. Screening for HIV without presence of risk factors Will check labs as below and f/u pending results. - HIV antibody (with reflex)  8. GAD (generalized anxiety disorder) Stable. Diagnosis pulled for medication refill. Continue current medical treatment plan. - ALPRAZolam (XANAX) 0.25 MG tablet; Take 1 tablet (0.25 mg total) by mouth daily as needed for anxiety.  Dispense: 90 tablet; Refill:  1  9. Situational mixed anxiety and depressive disorder Improved. Wants to decrease celexa to 20mg  as noted below. Call if symptoms recur.  - citalopram (CELEXA) 20 MG tablet; Take 1 tablet (20 mg total) by mouth daily.  Dispense: 90 tablet; Refill: 1  10. Need for influenza vaccination Flu vaccine given today without complication. Patient sat upright for 15 minutes to check for adverse reaction before being released. - Flu Vaccine QUAD 36+ mos PF IM (Fluarix & Fluzone Quad PF)  --------------------------------------------------------------------    Mar Daring, PA-C  [facility]

## 2019-07-24 ENCOUNTER — Other Ambulatory Visit: Payer: Self-pay | Admitting: Physician Assistant

## 2019-07-24 ENCOUNTER — Ambulatory Visit (INDEPENDENT_AMBULATORY_CARE_PROVIDER_SITE_OTHER): Payer: BC Managed Care – PPO | Admitting: Physician Assistant

## 2019-07-24 ENCOUNTER — Other Ambulatory Visit: Payer: Self-pay

## 2019-07-24 ENCOUNTER — Encounter: Payer: Self-pay | Admitting: Physician Assistant

## 2019-07-24 VITALS — BP 106/74 | HR 76 | Temp 97.8°F | Resp 16 | Ht 63.0 in | Wt 139.2 lb

## 2019-07-24 DIAGNOSIS — Z0000 Encounter for general adult medical examination without abnormal findings: Secondary | ICD-10-CM | POA: Diagnosis not present

## 2019-07-24 DIAGNOSIS — Z136 Encounter for screening for cardiovascular disorders: Secondary | ICD-10-CM

## 2019-07-24 DIAGNOSIS — Z13220 Encounter for screening for lipoid disorders: Secondary | ICD-10-CM

## 2019-07-24 DIAGNOSIS — Z902 Acquired absence of lung [part of]: Secondary | ICD-10-CM | POA: Diagnosis not present

## 2019-07-24 DIAGNOSIS — Z23 Encounter for immunization: Secondary | ICD-10-CM | POA: Diagnosis not present

## 2019-07-24 DIAGNOSIS — F411 Generalized anxiety disorder: Secondary | ICD-10-CM

## 2019-07-24 DIAGNOSIS — F4323 Adjustment disorder with mixed anxiety and depressed mood: Secondary | ICD-10-CM

## 2019-07-24 DIAGNOSIS — Z114 Encounter for screening for human immunodeficiency virus [HIV]: Secondary | ICD-10-CM

## 2019-07-24 DIAGNOSIS — E538 Deficiency of other specified B group vitamins: Secondary | ICD-10-CM | POA: Diagnosis not present

## 2019-07-24 DIAGNOSIS — Z131 Encounter for screening for diabetes mellitus: Secondary | ICD-10-CM

## 2019-07-24 DIAGNOSIS — Z1329 Encounter for screening for other suspected endocrine disorder: Secondary | ICD-10-CM

## 2019-07-24 MED ORDER — ALPRAZOLAM 0.25 MG PO TABS: 0.2500 mg | ORAL_TABLET | Freq: Every day | ORAL | 1 refills | Status: DC | PRN

## 2019-07-24 MED ORDER — CITALOPRAM HYDROBROMIDE 20 MG PO TABS: 20.0000 mg | ORAL_TABLET | Freq: Every day | ORAL | 1 refills | Status: DC

## 2019-07-24 NOTE — Patient Instructions (Signed)
Health Maintenance, Female Adopting a healthy lifestyle and getting preventive care are important in promoting health and wellness. Ask your health care provider about:  The right schedule for you to have regular tests and exams.  Things you can do on your own to prevent diseases and keep yourself healthy. What should I know about diet, weight, and exercise? Eat a healthy diet   Eat a diet that includes plenty of vegetables, fruits, low-fat dairy products, and lean protein.  Do not eat a lot of foods that are high in solid fats, added sugars, or sodium. Maintain a healthy weight Body mass index (BMI) is used to identify weight problems. It estimates body fat based on height and weight. Your health care provider can help determine your BMI and help you achieve or maintain a healthy weight. Get regular exercise Get regular exercise. This is one of the most important things you can do for your health. Most adults should:  Exercise for at least 150 minutes each week. The exercise should increase your heart rate and make you sweat (moderate-intensity exercise).  Do strengthening exercises at least twice a week. This is in addition to the moderate-intensity exercise.  Spend less time sitting. Even light physical activity can be beneficial. Watch cholesterol and blood lipids Have your blood tested for lipids and cholesterol at 35 years of age, then have this test every 5 years. Have your cholesterol levels checked more often if:  Your lipid or cholesterol levels are high.  You are older than 35 years of age.  You are at high risk for heart disease. What should I know about cancer screening? Depending on your health history and family history, you may need to have cancer screening at various ages. This may include screening for:  Breast cancer.  Cervical cancer.  Colorectal cancer.  Skin cancer.  Lung cancer. What should I know about heart disease, diabetes, and high blood  pressure? Blood pressure and heart disease  High blood pressure causes heart disease and increases the risk of stroke. This is more likely to develop in people who have high blood pressure readings, are of African descent, or are overweight.  Have your blood pressure checked: ? Every 3-5 years if you are 18-39 years of age. ? Every year if you are 40 years old or older. Diabetes Have regular diabetes screenings. This checks your fasting blood sugar level. Have the screening done:  Once every three years after age 40 if you are at a normal weight and have a low risk for diabetes.  More often and at a younger age if you are overweight or have a high risk for diabetes. What should I know about preventing infection? Hepatitis B If you have a higher risk for hepatitis B, you should be screened for this virus. Talk with your health care provider to find out if you are at risk for hepatitis B infection. Hepatitis C Testing is recommended for:  Everyone born from 1945 through 1965.  Anyone with known risk factors for hepatitis C. Sexually transmitted infections (STIs)  Get screened for STIs, including gonorrhea and chlamydia, if: ? You are sexually active and are younger than 35 years of age. ? You are older than 35 years of age and your health care provider tells you that you are at risk for this type of infection. ? Your sexual activity has changed since you were last screened, and you are at increased risk for chlamydia or gonorrhea. Ask your health care provider if   you are at risk.  Ask your health care provider about whether you are at high risk for HIV. Your health care provider may recommend a prescription medicine to help prevent HIV infection. If you choose to take medicine to prevent HIV, you should first get tested for HIV. You should then be tested every 3 months for as long as you are taking the medicine. Pregnancy  If you are about to stop having your period (premenopausal) and  you may become pregnant, seek counseling before you get pregnant.  Take 400 to 800 micrograms (mcg) of folic acid every day if you become pregnant.  Ask for birth control (contraception) if you want to prevent pregnancy. Osteoporosis and menopause Osteoporosis is a disease in which the bones lose minerals and strength with aging. This can result in bone fractures. If you are 65 years old or older, or if you are at risk for osteoporosis and fractures, ask your health care provider if you should:  Be screened for bone loss.  Take a calcium or vitamin D supplement to lower your risk of fractures.  Be given hormone replacement therapy (HRT) to treat symptoms of menopause. Follow these instructions at home: Lifestyle  Do not use any products that contain nicotine or tobacco, such as cigarettes, e-cigarettes, and chewing tobacco. If you need help quitting, ask your health care provider.  Do not use street drugs.  Do not share needles.  Ask your health care provider for help if you need support or information about quitting drugs. Alcohol use  Do not drink alcohol if: ? Your health care provider tells you not to drink. ? You are pregnant, may be pregnant, or are planning to become pregnant.  If you drink alcohol: ? Limit how much you use to 0-1 drink a day. ? Limit intake if you are breastfeeding.  Be aware of how much alcohol is in your drink. In the [place] one drink equals one 12 oz bottle of beer (355 mL), one 5 oz glass of wine (148 mL), or one 1 oz glass of hard liquor (44 mL). General instructions  Schedule regular health, dental, and eye exams.  Stay current with your vaccines.  Tell your health care provider if: ? You often feel depressed. ? You have ever been abused or do not feel safe at home. Summary  Adopting a healthy lifestyle and getting preventive care are important in promoting health and wellness.  Follow your health care provider's instructions about healthy  diet, exercising, and getting tested or screened for diseases.  Follow your health care provider's instructions on monitoring your cholesterol and blood pressure. This information is not intended to replace advice given to you by your health care provider. Make sure you discuss any questions you have with your health care provider. Document Released: 02/19/2011 Document Revised: 07/30/2018 Document Reviewed: 07/30/2018 Elsevier Patient Education  2020 Elsevier Inc.  

## 2019-07-25 LAB — COMPREHENSIVE METABOLIC PANEL
ALT: 6 IU/L (ref 0–32)
AST: 11 IU/L (ref 0–40)
Albumin/Globulin Ratio: 2.2 (ref 1.2–2.2)
Albumin: 4.7 g/dL (ref 3.8–4.8)
Alkaline Phosphatase: 49 IU/L (ref 39–117)
BUN/Creatinine Ratio: 13 (ref 9–23)
BUN: 9 mg/dL (ref 6–20)
Bilirubin Total: 0.7 mg/dL (ref 0.0–1.2)
CO2: 23 mmol/L (ref 20–29)
Calcium: 9.4 mg/dL (ref 8.7–10.2)
Chloride: 102 mmol/L (ref 96–106)
Creatinine, Ser: 0.68 mg/dL (ref 0.57–1.00)
GFR calc Af Amer: 131 mL/min/{1.73_m2} (ref >59)
GFR calc non Af Amer: 114 mL/min/{1.73_m2} (ref >59)
Globulin, Total: 2.1 g/dL (ref 1.5–4.5)
Glucose: 86 mg/dL (ref 65–99)
Potassium: 4.5 mmol/L (ref 3.5–5.2)
Sodium: 139 mmol/L (ref 134–144)
Total Protein: 6.8 g/dL (ref 6.0–8.5)

## 2019-07-25 LAB — CBC WITH DIFFERENTIAL/PLATELET
Basophils Absolute: 0 10*3/uL (ref 0.0–0.2)
Basos: 1 %
EOS (ABSOLUTE): 0.1 10*3/uL (ref 0.0–0.4)
Eos: 2 %
Hematocrit: 40.2 % (ref 34.0–46.6)
Hemoglobin: 13.6 g/dL (ref 11.1–15.9)
Immature Grans (Abs): 0 10*3/uL (ref 0.0–0.1)
Immature Granulocytes: 0 %
Lymphocytes Absolute: 1.5 10*3/uL (ref 0.7–3.1)
Lymphs: 35 %
MCH: 32.7 pg (ref 26.6–33.0)
MCHC: 33.8 g/dL (ref 31.5–35.7)
MCV: 97 fL (ref 79–97)
Monocytes Absolute: 0.4 10*3/uL (ref 0.1–0.9)
Monocytes: 9 %
Neutrophils Absolute: 2.3 10*3/uL (ref 1.4–7.0)
Neutrophils: 53 %
Platelets: 216 10*3/uL (ref 150–450)
RBC: 4.16 x10E6/uL (ref 3.77–5.28)
RDW: 11.3 % — ABNORMAL LOW (ref 11.7–15.4)
WBC: 4.4 10*3/uL (ref 3.4–10.8)

## 2019-07-25 LAB — LIPID PANEL
Chol/HDL Ratio: 1.8 ratio (ref 0.0–4.4)
Cholesterol, Total: 139 mg/dL (ref 100–199)
HDL: 79 mg/dL (ref >39)
LDL Chol Calc (NIH): 52 mg/dL (ref 0–99)
Triglycerides: 28 mg/dL (ref 0–149)
VLDL Cholesterol Cal: 8 mg/dL (ref 5–40)

## 2019-07-25 LAB — HEMOGLOBIN A1C
Est. average glucose Bld gHb Est-mCnc: 105 mg/dL
Hgb A1c MFr Bld: 5.3 % (ref 4.8–5.6)

## 2019-07-25 LAB — TSH: TSH: 2.45 u[IU]/mL (ref 0.450–4.500)

## 2019-07-25 LAB — VITAMIN B12: Vitamin B-12: 343 pg/mL (ref 232–1245)

## 2019-07-25 LAB — HIV ANTIBODY (ROUTINE TESTING W REFLEX): HIV Screen 4th Generation wRfx: NONREACTIVE

## 2019-07-27 ENCOUNTER — Telehealth: Payer: Self-pay

## 2019-07-27 ENCOUNTER — Other Ambulatory Visit: Payer: Self-pay

## 2019-07-27 DIAGNOSIS — R1011 Right upper quadrant pain: Secondary | ICD-10-CM

## 2019-07-27 NOTE — Telephone Encounter (Signed)
-----   Message from Mar Daring, [place] sent at 07/27/2019  8:21 AM EST ----- Blood count is normal. Kidney and liver function are normal. Sodium,potassium and calcium are normal. Cholesterol is great. A1c/sugar are normal. Thyroid is normal. HIV screen done once in a lifetime, unless exposed, is negative. B12 is normal and improved compared to last year. Continue current B12 supplemental dose in the multivitamin. It appears to be enough.

## 2019-07-27 NOTE — Telephone Encounter (Signed)
Viewed by Danae Orleans on 07/27/2019 8:48 AM

## 2019-07-29 ENCOUNTER — Other Ambulatory Visit: Payer: Self-pay

## 2019-07-29 ENCOUNTER — Ambulatory Visit: Payer: BC Managed Care – PPO

## 2019-07-29 ENCOUNTER — Ambulatory Visit
Admission: RE | Admit: 2019-07-29 | Discharge: 2019-07-29 | Disposition: A | Discharge status: Home / Self Care | Payer: BC Managed Care – PPO | Source: Ambulatory Visit | Attending: Gastroenterology | Admitting: Gastroenterology

## 2019-07-29 DIAGNOSIS — R1011 Right upper quadrant pain: Secondary | ICD-10-CM | POA: Insufficient documentation

## 2019-07-29 MED ORDER — IOHEXOL 300 MG/ML  SOLN
100.0000 mL | Freq: Once | INTRAMUSCULAR | Status: AC | PRN
  Administered 2019-07-29: 16:00:00 80 mL via INTRAVENOUS

## 2019-07-30 ENCOUNTER — Encounter: Payer: Self-pay | Admitting: Gastroenterology

## 2019-07-30 ENCOUNTER — Telehealth: Payer: Self-pay

## 2019-07-30 NOTE — Progress Notes (Signed)
Inform normal

## 2019-07-30 NOTE — Telephone Encounter (Signed)
-----   Message from Jonathon Bellows, MD sent at 07/30/2019  8:21 AM EST ----- Inform normal

## 2019-08-03 ENCOUNTER — Other Ambulatory Visit: Payer: Self-pay

## 2019-08-03 DIAGNOSIS — Z20822 Contact with and (suspected) exposure to covid-19: Secondary | ICD-10-CM

## 2019-08-04 ENCOUNTER — Ambulatory Visit: Payer: BC Managed Care – PPO | Admitting: Gastroenterology

## 2019-08-04 LAB — NOVEL CORONAVIRUS, NAA: SARS-CoV-2, NAA: NOT DETECTED

## 2019-08-12 ENCOUNTER — Ambulatory Visit: Payer: BC Managed Care – PPO | Attending: Internal Medicine

## 2019-08-12 DIAGNOSIS — Z20822 Contact with and (suspected) exposure to covid-19: Secondary | ICD-10-CM

## 2019-08-13 LAB — NOVEL CORONAVIRUS, NAA: SARS-CoV-2, NAA: NOT DETECTED

## 2019-08-25 ENCOUNTER — Ambulatory Visit (INDEPENDENT_AMBULATORY_CARE_PROVIDER_SITE_OTHER): Payer: BC Managed Care – PPO | Admitting: Gastroenterology

## 2019-08-25 ENCOUNTER — Encounter: Payer: Self-pay | Admitting: Gastroenterology

## 2019-08-25 ENCOUNTER — Other Ambulatory Visit: Payer: Self-pay

## 2019-08-25 VITALS — BP 119/80 | HR 77 | Temp 98.2°F | Ht 63.0 in | Wt 139.2 lb

## 2019-08-25 DIAGNOSIS — Z791 Long term (current) use of non-steroidal anti-inflammatories (NSAID): Secondary | ICD-10-CM

## 2019-08-25 DIAGNOSIS — R1013 Epigastric pain: Secondary | ICD-10-CM

## 2019-08-25 NOTE — Progress Notes (Signed)
Jonathon Bellows MD, MRCP(U.K) [address]  [place]  [place], [place] [address]  Main: [phone]  Fax: [phone]   Primary Care Physician: Mar Daring, PA-C  Primary Gastroenterologist:  Dr. Jonathon Bellows   Follow-up for abdominal pain  HPI: Valerie West is a 34 y.o. female    Summary of history :  Please seen and referred on 06/18/2019 for epigastric pain of over a year.  She is a history of left lower lobe lobectomy in April 2018 and found to have a carcinoid tumor of the lung.  CT scan of the chest in May 2020 showed no recurrence.  The abdominal pain has been occurring over 5 episodes each lasting more than a few days.  Squeezing in nature in the epigastrium which is not radiating worse after eating foods.  Usually occurs right after eating.  No clear relieving factors.  Dexilant helped partially but when Carafate was added made her feel much better.  She had been taking Excedrin on a daily basis since September for headaches.  Prior to September was taking it 1-2 times per week for many years.  Interval history 06/18/2019-08/25/2019  07/02/2019: EGD: Erythema seen along the greater curvature of the stomach otherwise normal.  Biopsies showed reactive gastropathy otherwise is negative. 06/18/2019: H. pylori breath test negative 07/29/2019: CT scan of the abdomen and pelvis negative 06/27/2018: Ultrasound abdomen normal Since 14 December she has had absolutely no abdominal pain and she has completely stopped taking any NSAIDs.  Current Outpatient Medications  Medication Sig Dispense Refill  . ALPRAZolam (XANAX) 0.25 MG tablet Take 1 tablet (0.25 mg total) by mouth daily as needed for anxiety. 90 tablet 1  . citalopram (CELEXA) 20 MG tablet Take 1 tablet (20 mg total) by mouth daily. 90 tablet 1  . dexlansoprazole (DEXILANT) 60 MG capsule Take 1 capsule (60 mg total) by mouth daily. 90 capsule 0  . Multiple Vitamin (MULTIVITAMIN) capsule Take by mouth.    .  rizatriptan (MAXALT) 10 MG tablet      No current facility-administered medications for this visit.    Allergies as of 08/25/2019  . (No Known Allergies)    ROS:  General: Negative for anorexia, weight loss, fever, chills, fatigue, weakness. ENT: Negative for hoarseness, difficulty swallowing , nasal congestion. CV: Negative for chest pain, angina, palpitations, dyspnea on exertion, peripheral edema.  Respiratory: Negative for dyspnea at rest, dyspnea on exertion, cough, sputum, wheezing.  GI: See history of present illness. GU:  Negative for dysuria, hematuria, urinary incontinence, urinary frequency, nocturnal urination.  Endo: Negative for unusual weight change.    Physical Examination:   There were no vitals taken for this visit.  General: Well-nourished, well-developed in no acute distress.  Eyes: No icterus. Conjunctivae pink. Mouth: Oropharyngeal mucosa moist and pink , no lesions erythema or exudate. Lungs: Clear to auscultation bilaterally. Non-labored. Heart: Regular rate and rhythm, no murmurs rubs or gallops.  Abdomen: Bowel sounds are normal, nontender, nondistended, no hepatosplenomegaly or masses, no abdominal bruits or hernia , no rebound or guarding.   Extremities: No lower extremity edema. No clubbing or deformities. Neuro: Alert and oriented x 3.  Grossly intact. Skin: Warm and dry, no jaundice.   Psych: Alert and cooperative, normal mood and affect.   Imaging Studies: CT ABDOMEN W CONTRAST  Result Date: 07/29/2019 CLINICAL DATA:  Right upper quadrant pain for approximately 1 year. Suspected cholecystitis. EXAM: CT ABDOMEN WITH CONTRAST TECHNIQUE: Multidetector CT imaging of the abdomen was performed  using the standard protocol following bolus administration of intravenous contrast. CONTRAST:  69mL OMNIPAQUE IOHEXOL 300 MG/ML  SOLN COMPARISON:  06/22/2016 FINDINGS: Image degradation by motion artifact noted. Lower chest: No acute findings. Hepatobiliary: No  hepatic masses identified. Gallbladder is unremarkable. No evidence of biliary ductal dilatation. Pancreas:  No mass or inflammatory changes. Spleen:  Within normal limits in size and appearance. Adrenals/Urinary Tract: No masses identified. No evidence of hydronephrosis. Stomach/Bowel: Visualized portion unremarkable. Vascular/Lymphatic: No pathologically enlarged lymph nodes identified. No abdominal aortic aneurysm. Other:  None. Musculoskeletal:  No suspicious bone lesions identified. IMPRESSION: Negative. No evidence of cholecystitis or other significant abnormality. Electronically Signed   By: Marlaine Hind M.D.   On: 07/29/2019 15:46    Assessment and Plan:   Valerie West is a 34 y.o. y/o female with initially seen for epigastric pain.  She had been on NSAIDs long-term at that point of time which she has stopped.  Evaluation including EGD, CT scan of the abdomen and H. pylori breath test have been negative.  She has responded well to a trial of Dexilant and the abdominal pain has resolved.  I suggested that she complete the course of Dexilant what she has and stop and see if the pain resolves.  If it recurs she is supposed to call my office right away.    Dr Jonathon Bellows  MD,MRCP [facility]) Follow up in as needed

## 2019-09-16 LAB — HM PAP SMEAR: HM Pap smear: NEGATIVE

## 2019-12-31 ENCOUNTER — Encounter: Payer: Self-pay | Admitting: Physician Assistant

## 2020-01-01 ENCOUNTER — Encounter: Payer: Self-pay | Admitting: Physician Assistant

## 2020-01-01 ENCOUNTER — Other Ambulatory Visit: Payer: Self-pay

## 2020-01-01 ENCOUNTER — Ambulatory Visit: Payer: BC Managed Care – PPO | Admitting: Physician Assistant

## 2020-01-01 VITALS — BP 112/62 | HR 78 | Temp 96.6°F | Wt 137.0 lb

## 2020-01-01 DIAGNOSIS — M79644 Pain in right finger(s): Secondary | ICD-10-CM | POA: Diagnosis not present

## 2020-01-01 MED ORDER — PREDNISONE 20 MG PO TABS: 20.0000 mg | ORAL_TABLET | Freq: Every day | ORAL | 0 refills | Status: DC

## 2020-01-01 NOTE — Patient Instructions (Signed)
Thumb spica splint or cock-up wrist splint

## 2020-01-01 NOTE — Progress Notes (Signed)
Established patient visit   Patient: Valerie West   DOB: 09-21-1985   34 y.o. Female  MRN: [id number] Visit Date: 01/01/2020  Today's healthcare provider: Mar Daring, PA-C   Chief Complaint  Patient presents with  . Hand Pain    Right hand; started about a month ago.    Subjective    Hand Pain  Incident onset: Right Hand/Thumb pain started about a month ago.  There was no injury mechanism. The pain is present in the right hand. The quality of the pain is described as aching (Dull). The pain has been worsening (Pt states the pain would only be at night, but in the last week her hand hurts constantly.) since the incident. Associated symptoms include muscle weakness and tingling. Pertinent negatives include no numbness. She has tried acetaminophen for the symptoms. The treatment provided mild relief.     Patient Active Problem List   Diagnosis Date Noted  . 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 S/P laparoscopic appendectomy 06/17/2017  . 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 Vitamin B12 deficiency 03/15/2015  . Migraine 02/14/2015  . Anxiety 01/27/2015  . Myofascial pain 01/27/2015  . Cervical muscle strain 01/27/2015  . Depression, postpartum 10/26/2013   Social History   Tobacco Use  . Smoking status: Never Smoker  . Smokeless tobacco: Never Used  Substance Use Topics  . Alcohol use: Yes    Alcohol/week: 0.0 standard drinks    Comment: occassional  . Drug use: No   No Known Allergies   Medications: Outpatient Medications Prior to Visit  Medication Sig  . ALPRAZolam (XANAX) 0.25 MG tablet Take 1 tablet (0.25 mg total) by mouth daily as needed for anxiety.  . citalopram (CELEXA) 20 MG tablet Take 1 tablet (20 mg total) by mouth daily. (Patient taking differently: Take 10 mg by mouth daily. )  . Multiple Vitamin (MULTIVITAMIN) capsule Take by mouth.  . rizatriptan (MAXALT) 10 MG tablet   . dexlansoprazole  (DEXILANT) 60 MG capsule Take 1 capsule (60 mg total) by mouth daily.   No facility-administered medications prior to visit.    Review of Systems  Constitutional: Negative.   Respiratory: Negative.   Cardiovascular: Negative.   Musculoskeletal: Positive for arthralgias. Negative for joint swelling.  Neurological: Positive for tingling and weakness. Negative for numbness.      Objective    BP 112/62 (BP Location: Right Arm, Patient Position: Sitting, Cuff Size: Large)   Pulse 78   Temp (!) 96.6 F (35.9 C) (Temporal)   Wt 137 lb (62.1 kg)   SpO2 98%   BMI 24.27 kg/m    Physical Exam Vitals reviewed.  Constitutional:      Appearance: Normal appearance. She is well-developed and normal weight.  HENT:     Head: Normocephalic and atraumatic.  Pulmonary:     Effort: Pulmonary effort is normal. No respiratory distress.  Musculoskeletal:     Right wrist: No swelling, deformity, tenderness, bony tenderness, snuff box tenderness or crepitus. Normal range of motion. Normal pulse.     Right hand: Bony tenderness (tenderness has been over 1st MCP joint, but today improved) present. No swelling. Normal range of motion. Normal strength. Normal sensation. Normal capillary refill. Normal pulse.     Left hand: Normal.     Cervical back: Normal range of motion and neck supple.     Comments: Negative tinel sign, negative phalen sign and negative Finkelstein test.  Neurological:     Mental Status: She is alert.  Psychiatric:        Mood and Affect: Mood normal.        Behavior: Behavior normal.        Thought Content: Thought content normal.        Judgment: Judgment normal.      No results found for any visits on 01/01/20.  Assessment & Plan     1. Pain of right thumb Will get imaging as below to r/o arthritic changes. Prednisone for inflammation, unable to take NSAIDs due to h/o gastritis. Advised could try a thumb spica splint during the day and a cock-up wrist splint at night.  Call if not improving and will refer to orthopedics.  - DG Hand Complete Right; Future - DG Wrist Complete Right; Future - predniSONE (DELTASONE) 20 MG tablet; Take 1 tablet (20 mg total) by mouth daily with breakfast.  Dispense: 10 tablet; Refill: 0   No follow-ups on file.      Reynolds Bowl, PA-C, have reviewed all documentation for this visit. The documentation on 01/01/20 for the exam, diagnosis, procedures, and orders are all accurate and complete.   Rubye Beach  [facility] [phone] (phone) [phone] (fax)  [place]

## 2020-02-23 ENCOUNTER — Other Ambulatory Visit: Payer: Self-pay

## 2020-02-23 ENCOUNTER — Other Ambulatory Visit: Payer: Self-pay | Admitting: Physician Assistant

## 2020-02-23 DIAGNOSIS — K219 Gastro-esophageal reflux disease without esophagitis: Secondary | ICD-10-CM

## 2020-02-23 MED ORDER — DEXILANT 60 MG PO CPDR
60.0000 mg | DELAYED_RELEASE_CAPSULE | Freq: Every day | ORAL | 1 refills | Status: DC
Start: 2020-02-23 — End: 2020-07-27

## 2020-03-05 ENCOUNTER — Other Ambulatory Visit: Payer: Self-pay | Admitting: Physician Assistant

## 2020-03-05 DIAGNOSIS — F411 Generalized anxiety disorder: Secondary | ICD-10-CM

## 2020-03-05 NOTE — Telephone Encounter (Signed)
Requested medication (s) are due for refill today: yes  Requested medication (s) are on the active medication list: yes  Last refill:  07/24/19  Future visit scheduled: yes  Notes to clinic:  med not delegated to NT to RF   Requested Prescriptions  Pending Prescriptions Disp Refills   ALPRAZolam (XANAX) 0.25 MG tablet [Pharmacy Med Name: ALPRAZOLAM 0.25 MG TABLET] 90 tablet     Sig: TAKE 1 TABLET BY MOUTH DAILY AS NEEDED FOR ANXIETY      Not Delegated - Psychiatry:  Anxiolytics/Hypnotics Failed - 03/05/2020  9:55 AM      Failed - This refill cannot be delegated      Failed - Urine Drug Screen completed in last 360 days.      Passed - Valid encounter within last 6 months    Recent Outpatient Visits           2 months ago Pain of right thumb   [place], Clearnce Sorrel, [place]   7 months ago Annual physical exam   [place], [place]   8 months ago Gastroesophageal reflux disease, unspecified whether esophagitis present   [place], [place]   9 months ago Gastroesophageal reflux disease without esophagitis   [facility], Clearnce Sorrel, PA-C   1 year ago Annual physical exam   [place], Clearnce Sorrel, [place]       Future Appointments             In 4 months [place], Clearnce Sorrel, PA-C [employer], [place]

## 2020-04-22 ENCOUNTER — Other Ambulatory Visit: Payer: Self-pay | Admitting: Gastroenterology

## 2020-07-26 ENCOUNTER — Encounter: Payer: Self-pay | Admitting: Physician Assistant

## 2020-07-26 NOTE — Progress Notes (Signed)
**Note Valerie-Identified via Obfuscation** Complete physical exam   Patient: Valerie West   DOB: [birth year]   34 y.o. Female  MRN: [id number] Visit Date: 07/27/2020  Today's healthcare provider: Mar Daring, PA-C   Chief Complaint  Patient presents with  . Annual Exam   Subjective    Valerie West is a 34 y.o. female who presents today for a complete physical exam.  She reports consuming a well balanced diet. Gym/ health club routine includes bootcamp. She generally feels well. She reports sleeping fairly well. She does not have additional problems to discuss today.  HPI  Pap-09/16/19-Normal, HPV Negative  Past Medical History:  Diagnosis Date  . Anxiety   . Complication of anesthesia   . Depression during pregnancy, antepartum unk  . PONV (postoperative nausea and vomiting)    Past Surgical History:  Procedure Laterality Date  . ESOPHAGOGASTRODUODENOSCOPY (EGD) WITH PROPOFOL N/A 07/02/2019   Procedure: ESOPHAGOGASTRODUODENOSCOPY (EGD) WITH PROPOFOL;  Surgeon: Jonathon Bellows, MD;  Location: [facility] ENDOSCOPY;  Service: Gastroenterology;  Laterality: N/A;  . LAPAROSCOPIC APPENDECTOMY N/A 06/23/2016   Procedure: APPENDECTOMY LAPAROSCOPIC;  Surgeon: Clayburn Pert, MD;  Location: [facility] ORS;  Service: General;  Laterality: N/A;  . LOBECTOMY Left 12/07/2016   Social History   Socioeconomic History  . Marital status: Married    Spouse name: Louie Casa  . Number of children: 2  . Years of education: 51  . Highest education level: Not on file  Occupational History  . Occupation: Product manager: [employer]  Tobacco Use  . Smoking status: Never Smoker  . Smokeless tobacco: Never Used  Substance and Sexual Activity  . Alcohol use: Yes    Alcohol/week: 0.0 standard drinks    Comment: occassional  . Drug use: No  . Sexual activity: Not on file  Other Topics Concern  . Not on file  Social History Narrative  . Not on file   Social Determinants of Health   Financial Resource Strain:   . Difficulty of Paying  Living Expenses: Not on file  Food Insecurity:   . Worried About Charity fundraiser in the Last Year: Not on file  . Ran Out of Food in the Last Year: Not on file  Transportation Needs:   . Lack of Transportation (Medical): Not on file  . Lack of Transportation (Non-Medical): Not on file  Physical Activity:   . Days of Exercise per Week: Not on file  . Minutes of Exercise per Session: Not on file  Stress:   . Feeling of Stress : Not on file  Social Connections:   . Frequency of Communication with Friends and Family: Not on file  . Frequency of Social Gatherings with Friends and Family: Not on file  . Attends Religious Services: Not on file  . Active Member of Clubs or Organizations: Not on file  . Attends Archivist Meetings: Not on file  . Marital Status: Not on file  Intimate Partner Violence:   . Fear of Current or Ex-Partner: Not on file  . Emotionally Abused: Not on file  . Physically Abused: Not on file  . Sexually Abused: Not on file   Family Status  Relation Name Status  . Father  Alive  . MGM  Alive  . MGF  Alive  . Mother  Alive   Family History  Problem Relation Age of Onset  . Depression Father   . Bipolar disorder Father   . Alcohol abuse Father   . Drug abuse  Father   . Cancer Maternal Grandmother   . Diabetes Maternal Grandmother   . Diabetes Maternal Grandfather   . Cancer Maternal Grandfather   . Hypertension Mother    No Known Allergies  Patient Care Team: Rubye Beach as PCP - General (Physician Assistant)   Medications: Outpatient Medications Prior to Visit  Medication Sig  . ALPRAZolam (XANAX) 0.25 MG tablet TAKE 1 TABLET BY MOUTH DAILY AS NEEDED FOR ANXIETY  . citalopram (CELEXA) 20 MG tablet Take 1 tablet (20 mg total) by mouth daily. (Patient taking differently: Take 10 mg by mouth daily. )  . Multiple Vitamin (MULTIVITAMIN) capsule Take by mouth.  . rizatriptan (MAXALT) 10 MG tablet   . dexlansoprazole (DEXILANT)  60 MG capsule Take 1 capsule (60 mg total) by mouth daily.  . predniSONE (DELTASONE) 20 MG tablet Take 1 tablet (20 mg total) by mouth daily with breakfast.   No facility-administered medications prior to visit.    Review of Systems  Constitutional: Negative.   HENT: Negative.   Eyes: Negative.   Respiratory: Negative.   Cardiovascular: Negative.   Gastrointestinal: Negative.   Endocrine: Negative.   Genitourinary: Negative.   Musculoskeletal: Negative.   Skin: Negative.   Allergic/Immunologic: Negative.   Neurological: Positive for headaches.  Hematological: Negative.   Psychiatric/Behavioral: The patient is nervous/anxious.     Last CBC Lab Results  Component Value Date   WBC 4.4 07/24/2019   HGB 13.6 07/24/2019   HCT 40.2 07/24/2019   MCV 97 07/24/2019   MCH 32.7 07/24/2019   RDW 11.3 (L) 07/24/2019   PLT 216 12/87/8676   Last metabolic panel Lab Results  Component Value Date   GLUCOSE 86 07/24/2019   NA 139 07/24/2019   K 4.5 07/24/2019   CL 102 07/24/2019   CO2 23 07/24/2019   BUN 9 07/24/2019   CREATININE 0.68 07/24/2019   GFRNONAA 114 07/24/2019   GFRAA 131 07/24/2019   CALCIUM 9.4 07/24/2019   PROT 6.8 07/24/2019   ALBUMIN 4.7 07/24/2019   LABGLOB 2.1 07/24/2019   AGRATIO 2.2 07/24/2019   BILITOT 0.7 07/24/2019   ALKPHOS 49 07/24/2019   AST 11 07/24/2019   ALT 6 07/24/2019   ANIONGAP 6 06/23/2016      Objective    There were no vitals taken for this visit. BP Readings from Last 3 Encounters:  07/27/20 96/63  01/01/20 112/62  08/25/19 119/80   Wt Readings from Last 3 Encounters:  07/27/20 139 lb 12.8 oz (63.4 kg)  01/01/20 137 lb (62.1 kg)  08/25/19 139 lb 3.2 oz (63.1 kg)      Physical Exam Vitals reviewed.  Constitutional:      General: She is not in acute distress.    Appearance: Normal appearance. She is well-developed, normal weight and well-nourished. She is not ill-appearing or diaphoretic.  HENT:     Head: Normocephalic and  atraumatic.     Right Ear: Tympanic membrane, ear canal and external ear normal.     Left Ear: Tympanic membrane, ear canal and external ear normal.     Nose: Nose normal.     Mouth/Throat:     Mouth: Oropharynx is clear and moist. Mucous membranes are moist.     Pharynx: Oropharynx is clear. No oropharyngeal exudate or posterior oropharyngeal erythema.  Eyes:     General: No scleral icterus.       Right eye: No discharge.        Left eye: No discharge.  Extraocular Movements: Extraocular movements intact and EOM normal.     Conjunctiva/sclera: Conjunctivae normal.     Pupils: Pupils are equal, round, and reactive to light.  Neck:     Thyroid: No thyromegaly.     Vascular: No JVD.     Trachea: No tracheal deviation.  Cardiovascular:     Rate and Rhythm: Normal rate and regular rhythm.     Pulses: Normal pulses and intact distal pulses.     Heart sounds: Normal heart sounds. No murmur heard. No friction rub. No gallop.   Pulmonary:     Effort: Pulmonary effort is normal. No respiratory distress.     Breath sounds: Normal breath sounds. No wheezing or rales.  Chest:     Chest wall: No tenderness.  Abdominal:     General: Abdomen is flat. Bowel sounds are normal. There is no distension.     Palpations: Abdomen is soft. There is no mass.     Tenderness: There is no abdominal tenderness. There is no guarding or rebound.  Musculoskeletal:        General: No tenderness or edema. Normal range of motion.     Cervical back: Normal range of motion and neck supple. No tenderness.     Right lower leg: No edema.     Left lower leg: No edema.  Lymphadenopathy:     Cervical: No cervical adenopathy.  Skin:    General: Skin is warm and dry.     Capillary Refill: Capillary refill takes less than 2 seconds.     Findings: No rash.  Neurological:     General: No focal deficit present.     Mental Status: She is alert and oriented to person, place, and time. Mental status is at baseline.   Psychiatric:        Mood and Affect: Mood and affect and mood normal.        Behavior: Behavior normal.        Thought Content: Thought content normal.        Judgment: Judgment normal.      Last depression screening scores PHQ 2/9 Scores 07/27/2020 07/24/2019 07/07/2018  PHQ - 2 Score 0 1 0  PHQ- 9 Score - 3 3   Last fall risk screening No flowsheet data found. Last Audit-C alcohol use screening Alcohol Use Disorder Test (AUDIT) 07/24/2019  1. How often do you have a drink containing alcohol? 2  2. How many drinks containing alcohol do you have on a typical day when you are drinking? 0  3. How often do you have six or more drinks on one occasion? 0  AUDIT-C Score 2  Alcohol Brief Interventions/Follow-up AUDIT Score <7 follow-up not indicated   A score of 3 or more in women, and 4 or more in men indicates increased risk for alcohol abuse, EXCEPT if all of the points are from question 1   No results found for any visits on 07/27/20.  Assessment & Plan    Routine Health Maintenance and Physical Exam  Exercise Activities and Dietary recommendations Goals   None     Immunization History  Administered Date(s) Administered  . Influenza,inj,Quad PF,6+ Mos 06/15/2015, 06/17/2017, 07/24/2019  . Influenza-Unspecified 05/28/2018  . Tdap 12/22/2013    Health Maintenance  Topic Date Due  . Hepatitis C Screening  Never done  . COVID-19 Vaccine (1) Never done  . INFLUENZA VACCINE  03/20/2020  . PAP SMEAR-Modifier  09/15/2022  . TETANUS/TDAP  12/23/2023  . HIV Screening  Completed    Discussed health benefits of physical activity, and encouraged her to engage in regular exercise appropriate for her age and condition.  1. Annual physical exam Normal physical exam today. Will check labs as below and f/u pending lab results. If labs are stable and WNL she will not need to have these rechecked for one year at her next annual physical exam. She is to call the office in the meantime  if she has any acute issue, questions or concerns. - CBC with Differential/Platelet - Comprehensive metabolic panel - Lipid panel - TSH  2. Migraine with aura and without status migrainosus, not intractable Stable. Diagnosis pulled for medication refill. Continue current medical treatment plan. - rizatriptan (MAXALT) 10 MG tablet; Take 1 tablet (10 mg total) by mouth every 2 (two) hours as needed for migraine.  Dispense: 10 tablet; Refill: 11  3. Carcinoid tumor of left lung Stable. [facility].   4. Anxiety Doing well. Has decreased from 20g to 10mg . Dose updated and refilled.  - citalopram (CELEXA) 10 MG tablet; Take 1 tablet (10 mg total) by mouth daily.  Dispense: 90 tablet; Refill: 3  5. Encounter for hepatitis C screening test for low risk patient Will check labs as below and f/u pending results. - Hepatitis C Antibody   No follow-ups on file.     Reynolds Bowl, PA-C, have reviewed all documentation for this visit. The documentation on 08/02/20 for the exam, diagnosis, procedures, and orders are all accurate and complete.   Rubye Beach  [facility] [phone] (phone) [phone] (fax)  [place]

## 2020-07-27 ENCOUNTER — Other Ambulatory Visit: Payer: Self-pay

## 2020-07-27 ENCOUNTER — Ambulatory Visit (INDEPENDENT_AMBULATORY_CARE_PROVIDER_SITE_OTHER): Payer: BC Managed Care – PPO | Admitting: Physician Assistant

## 2020-07-27 VITALS — BP 96/63 | Temp 98.2°F | Resp 16 | Ht 63.0 in | Wt 139.8 lb

## 2020-07-27 DIAGNOSIS — G43109 Migraine with aura, not intractable, without status migrainosus: Secondary | ICD-10-CM

## 2020-07-27 DIAGNOSIS — Z0000 Encounter for general adult medical examination without abnormal findings: Secondary | ICD-10-CM

## 2020-07-27 DIAGNOSIS — F419 Anxiety disorder, unspecified: Secondary | ICD-10-CM | POA: Diagnosis not present

## 2020-07-27 DIAGNOSIS — D3A090 Benign carcinoid tumor of the bronchus and lung: Secondary | ICD-10-CM

## 2020-07-27 DIAGNOSIS — Z1159 Encounter for screening for other viral diseases: Secondary | ICD-10-CM

## 2020-07-27 MED ORDER — RIZATRIPTAN BENZOATE 10 MG PO TABS: 10.0000 mg | ORAL_TABLET | ORAL | 11 refills | Status: DC | PRN

## 2020-07-27 MED ORDER — CITALOPRAM HYDROBROMIDE 10 MG PO TABS: 10.0000 mg | ORAL_TABLET | Freq: Every day | ORAL | 3 refills | Status: DC

## 2020-07-27 NOTE — Patient Instructions (Signed)
Preventive Care 21-36 Years Old, Female Preventive care refers to visits with your health care provider and lifestyle choices that can promote health and wellness. This includes:  A yearly physical exam. This may also be called an annual well check.  Regular dental visits and eye exams.  Immunizations.  Screening for certain conditions.  Healthy lifestyle choices, such as eating a healthy diet, getting regular exercise, not using drugs or products that contain nicotine and tobacco, and limiting alcohol use. What can I expect for my preventive care visit? Physical exam Your health care provider will check your:  Height and weight. This may be used to calculate body mass index (BMI), which tells if you are at a healthy weight.  Heart rate and blood pressure.  Skin for abnormal spots. Counseling Your health care provider may ask you questions about your:  Alcohol, tobacco, and drug use.  Emotional well-being.  Home and relationship well-being.  Sexual activity.  Eating habits.  Work and work environment.  Method of birth control.  Menstrual cycle.  Pregnancy history. What immunizations do I need?  Influenza (flu) vaccine  This is recommended every year. Tetanus, diphtheria, and pertussis (Tdap) vaccine  You may need a Td booster every 10 years. Varicella (chickenpox) vaccine  You may need this if you have not been vaccinated. Human papillomavirus (HPV) vaccine  If recommended by your health care provider, you may need three doses over 6 months. Measles, mumps, and rubella (MMR) vaccine  You may need at least one dose of MMR. You may also need a second dose. Meningococcal conjugate (MenACWY) vaccine  One dose is recommended if you are age 19-21 years and a first-year college student living in a residence hall, or if you have one of several medical conditions. You may also need additional booster doses. Pneumococcal conjugate (PCV13) vaccine  You may need  this if you have certain conditions and were not previously vaccinated. Pneumococcal polysaccharide (PPSV23) vaccine  You may need one or two doses if you smoke cigarettes or if you have certain conditions. Hepatitis A vaccine  You may need this if you have certain conditions or if you travel or work in places where you may be exposed to hepatitis A. Hepatitis B vaccine  You may need this if you have certain conditions or if you travel or work in places where you may be exposed to hepatitis B. Haemophilus influenzae type b (Hib) vaccine  You may need this if you have certain conditions. You may receive vaccines as individual doses or as more than one vaccine together in one shot (combination vaccines). Talk with your health care provider about the risks and benefits of combination vaccines. What tests do I need?  Blood tests  Lipid and cholesterol levels. These may be checked every 5 years starting at age 20.  Hepatitis C test.  Hepatitis B test. Screening  Diabetes screening. This is done by checking your blood sugar (glucose) after you have not eaten for a while (fasting).  Sexually transmitted disease (STD) testing.  BRCA-related cancer screening. This may be done if you have a family history of breast, ovarian, tubal, or peritoneal cancers.  Pelvic exam and Pap test. This may be done every 3 years starting at age 21. Starting at age 30, this may be done every 5 years if you have a Pap test in combination with an HPV test. Talk with your health care provider about your test results, treatment options, and if necessary, the need for more tests.   Follow these instructions at home: Eating and drinking   Eat a diet that includes fresh fruits and vegetables, whole grains, lean protein, and low-fat dairy.  Take vitamin and mineral supplements as recommended by your health care provider.  Do not drink alcohol if: ? Your health care provider tells you not to drink. ? You are  pregnant, may be pregnant, or are planning to become pregnant.  If you drink alcohol: ? Limit how much you have to 0-1 drink a day. ? Be aware of how much alcohol is in your drink. In the [place] one drink equals one 12 oz bottle of beer (355 mL), one 5 oz glass of wine (148 mL), or one 1 oz glass of hard liquor (44 mL). Lifestyle  Take daily care of your teeth and gums.  Stay active. Exercise for at least 30 minutes on 5 or more days each week.  Do not use any products that contain nicotine or tobacco, such as cigarettes, e-cigarettes, and chewing tobacco. If you need help quitting, ask your health care provider.  If you are sexually active, practice safe sex. Use a condom or other form of birth control (contraception) in order to prevent pregnancy and STIs (sexually transmitted infections). If you plan to become pregnant, see your health care provider for a preconception visit. What's next?  Visit your health care provider once a year for a well check visit.  Ask your health care provider how often you should have your eyes and teeth checked.  Stay up to date on all vaccines. This information is not intended to replace advice given to you by your health care provider. Make sure you discuss any questions you have with your health care provider. Document Revised: 04/17/2018 Document Reviewed: 04/17/2018 Elsevier Patient Education  2020 [employer].

## 2020-07-28 ENCOUNTER — Encounter: Payer: Self-pay | Admitting: Physician Assistant

## 2020-08-02 ENCOUNTER — Encounter: Payer: Self-pay | Admitting: Physician Assistant

## 2020-08-05 ENCOUNTER — Encounter: Payer: Self-pay | Admitting: Physician Assistant

## 2020-08-05 ENCOUNTER — Telehealth: Payer: Self-pay | Admitting: Physician Assistant

## 2020-08-05 LAB — CBC WITH DIFFERENTIAL/PLATELET
Basophils Absolute: 0 10*3/uL (ref 0.0–0.2)
Basos: 1 %
EOS (ABSOLUTE): 0.1 10*3/uL (ref 0.0–0.4)
Eos: 3 %
Hematocrit: 40.4 % (ref 34.0–46.6)
Hemoglobin: 13.9 g/dL (ref 11.1–15.9)
Immature Grans (Abs): 0 10*3/uL (ref 0.0–0.1)
Immature Granulocytes: 0 %
Lymphocytes Absolute: 1.6 10*3/uL (ref 0.7–3.1)
Lymphs: 36 %
MCH: 33.3 pg — ABNORMAL HIGH (ref 26.6–33.0)
MCHC: 34.4 g/dL (ref 31.5–35.7)
MCV: 97 fL (ref 79–97)
Monocytes Absolute: 0.4 10*3/uL (ref 0.1–0.9)
Monocytes: 9 %
Neutrophils Absolute: 2.2 10*3/uL (ref 1.4–7.0)
Neutrophils: 51 %
Platelets: 221 10*3/uL (ref 150–450)
RBC: 4.17 x10E6/uL (ref 3.77–5.28)
RDW: 11.8 % (ref 11.7–15.4)
WBC: 4.4 10*3/uL (ref 3.4–10.8)

## 2020-08-05 LAB — COMPREHENSIVE METABOLIC PANEL
ALT: 8 IU/L (ref 0–32)
AST: 14 IU/L (ref 0–40)
Albumin/Globulin Ratio: 2.6 — ABNORMAL HIGH (ref 1.2–2.2)
Albumin: 4.4 g/dL (ref 3.8–4.8)
Alkaline Phosphatase: 53 IU/L (ref 44–121)
BUN/Creatinine Ratio: 14 (ref 9–23)
BUN: 11 mg/dL (ref 6–20)
Bilirubin Total: 0.6 mg/dL (ref 0.0–1.2)
CO2: 23 mmol/L (ref 20–29)
Calcium: 8.9 mg/dL (ref 8.7–10.2)
Chloride: 102 mmol/L (ref 96–106)
Creatinine, Ser: 0.8 mg/dL (ref 0.57–1.00)
GFR calc Af Amer: 110 mL/min/{1.73_m2} (ref >59)
GFR calc non Af Amer: 95 mL/min/{1.73_m2} (ref >59)
Globulin, Total: 1.7 g/dL (ref 1.5–4.5)
Glucose: 84 mg/dL (ref 65–99)
Potassium: 4 mmol/L (ref 3.5–5.2)
Sodium: 138 mmol/L (ref 134–144)
Total Protein: 6.1 g/dL (ref 6.0–8.5)

## 2020-08-05 LAB — LIPID PANEL
Chol/HDL Ratio: 1.6 ratio (ref 0.0–4.4)
Cholesterol, Total: 127 mg/dL (ref 100–199)
HDL: 77 mg/dL (ref >39)
LDL Chol Calc (NIH): 41 mg/dL (ref 0–99)
Triglycerides: 30 mg/dL (ref 0–149)
VLDL Cholesterol Cal: 9 mg/dL (ref 5–40)

## 2020-08-05 LAB — HEPATITIS C ANTIBODY: Hep C Virus Ab: <0.1 s/co ratio (ref 0.0–0.9)

## 2020-08-05 LAB — TSH: TSH: 2.73 u[IU]/mL (ref 0.450–4.500)

## 2020-08-05 NOTE — Telephone Encounter (Signed)
Please review. Thanks!  

## 2020-08-05 NOTE — Telephone Encounter (Signed)
Patient viewed lab results through my chart and would like to speak with a nurse to discuss, please advise

## 2020-08-05 NOTE — Progress Notes (Signed)
Patient reviewed results via MyChart 

## 2020-08-21 ENCOUNTER — Other Ambulatory Visit: Payer: Self-pay | Admitting: Physician Assistant

## 2020-08-21 DIAGNOSIS — F4323 Adjustment disorder with mixed anxiety and depressed mood: Secondary | ICD-10-CM

## 2020-08-29 ENCOUNTER — Other Ambulatory Visit: Payer: BC Managed Care – PPO

## 2020-08-29 ENCOUNTER — Ambulatory Visit: Payer: Self-pay

## 2020-12-21 ENCOUNTER — Encounter: Payer: Self-pay | Admitting: Physician Assistant

## 2020-12-21 ENCOUNTER — Telehealth: Payer: BC Managed Care – PPO | Admitting: Physician Assistant

## 2020-12-21 DIAGNOSIS — J019 Acute sinusitis, unspecified: Secondary | ICD-10-CM

## 2020-12-21 DIAGNOSIS — B9689 Other specified bacterial agents as the cause of diseases classified elsewhere: Secondary | ICD-10-CM | POA: Diagnosis not present

## 2020-12-21 MED ORDER — AMOXICILLIN-POT CLAVULANATE 875-125 MG PO TABS: 1.0000 | ORAL_TABLET | Freq: Two times a day (BID) | ORAL | 0 refills | Status: DC

## 2020-12-21 NOTE — Patient Instructions (Signed)
Instructions sent to patients MyChart.

## 2020-12-21 NOTE — Progress Notes (Signed)
Ms. Valerie West, Valerie West are scheduled for a virtual visit with your provider today.    Just as we do with appointments in the office, we must obtain your consent to participate.  Your consent will be active for this visit and any virtual visit you may have with one of our providers in the next 365 days.    If you have a MyChart account, I can also send a copy of this consent to you electronically.  All virtual visits are billed to your insurance company just like a traditional visit in the office.  As this is a virtual visit, video technology does not allow for your provider to perform a traditional examination.  This may limit your provider's ability to fully assess your condition.  If your provider identifies any concerns that need to be evaluated in person or the need to arrange testing such as labs, EKG, etc, we will make arrangements to do so.    Although advances in technology are sophisticated, we cannot ensure that it will always work on either your end or our end.  If the connection with a video visit is poor, we may have toswitch to a telephone visit.  With either a video or telephone visit, we are not always able to ensure that we have a secure connection.   I need to obtain your verbal consent now.   Are you willing to proceed with your visit today?   Valerie West has provided verbal consent on 12/21/2020 for a virtual visit (video or telephone).   Leeanne Rio, PA-C 12/21/2020  8:50 AM  Virtual Visit via Video   I connected with patient on 12/21/20 at  8:45 AM EDT by a video enabled telemedicine application and verified that I am speaking with the correct person using two identifiers.  Location patient: Home Location provider: [place] participating in the virtual visit: Patient, Provider  I discussed the limitations of evaluation and management by telemedicine and the availability of in person appointments. The patient expressed understanding and agreed to  proceed.  Subjective:   HPI:   Patient presents via [facility] today complaining of 2 weeks of what she initially thought were allergy symptoms but have continued to progress and worsen since onset.  Initially noting a mild scratchy throat and nasal congestion.  This merged into more sinus pressure with increasing congestion and cough that is worse at night and early morning.  Now with substantial sinus and maxillary pain/cheek pain.  Denies any fever, chills or aches.  Denies ear pain, chest pain or windedness.  Is status post partial lobectomy in 2018.  No change in breathing from baseline.  Has been taking over-the-counter cough and cold medicine with only some improvement in her symptoms.  Of note, she endorses having 2 separate COVID tests just to be cautious, both of which were negative.  Notes she is currently without a primary care provider as her PCP recently left their practice.  ROS:   See pertinent positives and negatives per HPI.  Patient Active Problem List   Diagnosis Date Noted  . 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 S/P laparoscopic appendectomy 06/17/2017  . 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 Vitamin B12 deficiency 03/15/2015  . Migraine 02/14/2015  . Anxiety 01/27/2015  . Myofascial pain 01/27/2015  . Cervical muscle strain 01/27/2015  . 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 Smoking status:  Never Smoker  . Smokeless tobacco: Never Used  Substance Use Topics  . Alcohol use: Yes    Alcohol/week: 0.0 standard drinks    Comment: occassional    Current Outpatient Medications:  .  ALPRAZolam (XANAX) 0.25 MG tablet, TAKE 1 TABLET BY MOUTH DAILY AS NEEDED FOR ANXIETY, Disp: 90 tablet, Rfl: 1 .  citalopram (CELEXA) 10 MG tablet, Take 1 tablet (10 mg total) by mouth daily., Disp: 90 tablet, Rfl: 3 .  Multiple Vitamin (MULTIVITAMIN) capsule, Take by mouth., Disp: , Rfl:  .  rizatriptan (MAXALT)  10 MG tablet, Take 1 tablet (10 mg total) by mouth every 2 (two) hours as needed for migraine., Disp: 10 tablet, Rfl: 11  No Known Allergies  Objective:   There were no vitals taken for this visit.  Patient is well-developed, well-nourished in no acute distress.  Resting comfortably at home.  Head is normocephalic, atraumatic.  No labored breathing.  Speech is clear and coherent with logical content.  Patient is alert and oriented at baseline.  Positive TTP of bilateral maxillary sinuses.  Assessment and Plan:   1. Acute bacterial sinusitis Rx Augmentin.  Increase fluids.  Rest.  Saline nasal spray.  Probiotic.  Mucinex as directed.  Humidifier in bedroom.  Recommend OTC Flonase.  Okay to continue her OTC sinus medication.  Follow-up precautions discussed.  Patient voiced understanding and agreement with plan.  Written copy of instructions was sent to the patient's MyChart.  - amoxicillin-clavulanate (AUGMENTIN) 875-125 MG tablet; Take 1 tablet by mouth 2 (two) times daily.  Dispense: 14 tablet; Refill: 0 .   Leeanne Rio, PA-C 12/21/2020

## 2021-01-16 ENCOUNTER — Other Ambulatory Visit: Payer: Self-pay | Admitting: Physician Assistant

## 2021-01-16 DIAGNOSIS — F411 Generalized anxiety disorder: Secondary | ICD-10-CM

## 2021-01-19 ENCOUNTER — Telehealth: Payer: Self-pay | Admitting: Family Medicine

## 2021-01-19 NOTE — Telephone Encounter (Signed)
Copied from [place] [id number]. Topic: Quick Communication - Rx Refill/Question >> Jan 19, 2021 10:03 AM Leward Quan A wrote: Medication: ALPRAZolam Duanne Moron) 0.25 MG tablet Patient informed she may need to be seen  Has the patient contacted their pharmacy? Yes.   (Agent: If no, request that the patient contact the pharmacy for the refill.) (Agent: If yes, when and what did the pharmacy advise?)  Preferred Pharmacy (with phone number or street name): [id number] [place] [address]  Phone:  [phone] Fax:  [phone]     Agent: Please be advised that RX refills may take up to 3 business days. We ask that you follow-up with your pharmacy.

## 2021-07-28 ENCOUNTER — Encounter: Payer: BC Managed Care – PPO | Admitting: Physician Assistant

## 2021-08-01 ENCOUNTER — Encounter: Payer: BC Managed Care – PPO | Admitting: Family Medicine

## 2021-08-03 ENCOUNTER — Other Ambulatory Visit: Payer: Self-pay | Admitting: Physician Assistant

## 2021-08-03 DIAGNOSIS — F419 Anxiety disorder, unspecified: Secondary | ICD-10-CM

## 2021-08-09 ENCOUNTER — Other Ambulatory Visit: Payer: Self-pay

## 2021-08-09 ENCOUNTER — Encounter: Payer: Self-pay | Admitting: Family Medicine

## 2021-08-09 ENCOUNTER — Ambulatory Visit (INDEPENDENT_AMBULATORY_CARE_PROVIDER_SITE_OTHER): Payer: BC Managed Care – PPO | Admitting: Family Medicine

## 2021-08-09 VITALS — BP 92/60 | HR 72 | Resp 15 | Ht 63.0 in | Wt 133.3 lb

## 2021-08-09 DIAGNOSIS — Z0000 Encounter for general adult medical examination without abnormal findings: Secondary | ICD-10-CM

## 2021-08-09 DIAGNOSIS — F411 Generalized anxiety disorder: Secondary | ICD-10-CM | POA: Diagnosis not present

## 2021-08-09 DIAGNOSIS — D3A090 Benign carcinoid tumor of the bronchus and lung: Secondary | ICD-10-CM

## 2021-08-09 DIAGNOSIS — F419 Anxiety disorder, unspecified: Secondary | ICD-10-CM | POA: Diagnosis not present

## 2021-08-09 DIAGNOSIS — R770 Abnormality of albumin: Secondary | ICD-10-CM

## 2021-08-09 DIAGNOSIS — F5104 Psychophysiologic insomnia: Secondary | ICD-10-CM

## 2021-08-09 DIAGNOSIS — G43109 Migraine with aura, not intractable, without status migrainosus: Secondary | ICD-10-CM

## 2021-08-09 MED ORDER — ALPRAZOLAM 0.25 MG PO TABS: 0.2500 mg | ORAL_TABLET | ORAL | 0 refills | Status: DC | PRN

## 2021-08-09 MED ORDER — RIZATRIPTAN BENZOATE 10 MG PO TABS: 10.0000 mg | ORAL_TABLET | ORAL | 4 refills | Status: DC | PRN

## 2021-08-09 MED ORDER — CITALOPRAM HYDROBROMIDE 10 MG PO TABS: 10.0000 mg | ORAL_TABLET | Freq: Every day | ORAL | 3 refills | Status: DC

## 2021-08-09 NOTE — Assessment & Plan Note (Signed)
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  - Eat a low-fat diet with lots of fruits and vegetables, up to 7-9 servings per day.  - Seatbelts can save your life. Wear them always.  - Smoke detectors on every level of your home, check batteries every year.  - Eye Doctor - have an eye exam every 1-2 years  - Safe sex - if you may be exposed to STDs, use a condom.  - Alcohol -  If you drink, do it moderately, less than 2 drinks per day.  - [place]. Choose someone to speak for you if you are not able.  - Depression is common in our stressful world.If you're feeling down or losing interest in things you normally enjoy, please come in for a visit.  - Violence - If anyone is threatening or hurting you, please call immediately.

## 2021-08-09 NOTE — Assessment & Plan Note (Signed)
Chronic, stable Some wax/wane r/t work/holidays Noted sleep difficulties d/t not being able to shut off her mind Continue celexa at this time Refill provided

## 2021-08-09 NOTE — Assessment & Plan Note (Signed)
2 days per month occ weather relation Refill of medication provided Aware of triggers

## 2021-08-09 NOTE — Patient Instructions (Signed)
Need an appointment of some type in 6 months for Xanax, as it is an controlled substance  Nice to meet you! Again!

## 2021-08-09 NOTE — Assessment & Plan Note (Signed)
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

## 2021-08-09 NOTE — Assessment & Plan Note (Signed)
Use of PRN medication for breakthrough anxiety Aware of 6 month return to clinic for f/u given controlled substance Refill provided PDMP reviewed

## 2021-08-09 NOTE — Assessment & Plan Note (Signed)
Repeat chemistry panel

## 2021-08-09 NOTE — Assessment & Plan Note (Signed)
11/2016 Completed at [facility] by Hervey Ard Yearly follow-up Denies concerns

## 2021-08-09 NOTE — Progress Notes (Signed)
Complete physical exam   Patient: Valerie West   DOB: Aug 07, 1986   35 y.o. Female  MRN: [id number] Visit Date: 08/09/2021  Today's healthcare provider: Gwyneth Sprout, FNP   Chief Complaint  Patient presents with   Annual Exam   Subjective     HPI  Valerie West is a 35 y.o. female who presents today for a complete physical exam.  She reports consuming a general diet. Gym/ health club routine includes cardio. She generally feels well. She reports sleeping fairly well, patient reports difficulty falling asleep for the past few weeks. She does not have additional problems to discuss today.  Last Reported Pap 09/16/19  Past Medical History:  Diagnosis Date   Anxiety    Complication of anesthesia    Depression during pregnancy, antepartum unk   PONV (postoperative nausea and vomiting)    Past Surgical History:  Procedure Laterality Date   ESOPHAGOGASTRODUODENOSCOPY (EGD) WITH PROPOFOL N/A 07/02/2019   Procedure: ESOPHAGOGASTRODUODENOSCOPY (EGD) WITH PROPOFOL;  Surgeon: Jonathon Bellows, MD;  Location: [facility] ENDOSCOPY;  Service: Gastroenterology;  Laterality: N/A;   LAPAROSCOPIC APPENDECTOMY N/A 06/23/2016   Procedure: APPENDECTOMY LAPAROSCOPIC;  Surgeon: Clayburn Pert, MD;  Location: [facility] ORS;  Service: General;  Laterality: N/A;   LOBECTOMY Left 12/07/2016   Social History   Socioeconomic History   Marital status: Married    Spouse name: Valerie West   Number of children: 2   Years of education: 17   Highest education level: Not on file  Occupational History   Occupation: Product manager: [employer]  Tobacco Use   Smoking status: Never   Smokeless tobacco: Never  Substance and Sexual Activity   Alcohol use: Yes    Alcohol/week: 0.0 standard drinks    Comment: occassional   Drug use: No   Sexual activity: Not on file  Other Topics Concern   Not on file  Social History Narrative   Not on file   Social Determinants of Health   Financial Resource Strain: Not on file   Food Insecurity: Not on file  Transportation Needs: Not on file  Physical Activity: Not on file  Stress: Not on file  Social Connections: Not on file  Intimate Partner Violence: Not on file   Family Status  Relation Name Status   Father  Alive   MGM  Alive   MGF  Alive   Mother  Alive   Family History  Problem Relation Age of Onset   Depression Father    Bipolar disorder Father    Alcohol abuse Father    Drug abuse Father    Cancer Maternal Grandmother    Diabetes Maternal Grandmother    Diabetes Maternal Grandfather    Cancer Maternal Grandfather    Hypertension Mother    No Known Allergies  Patient Care Team: Gwyneth Sprout, FNP as PCP - General (Family Medicine)   Medications: Outpatient Medications Prior to Visit  Medication Sig   Multiple Vitamin (MULTIVITAMIN) capsule Take by mouth.   [DISCONTINUED] ALPRAZolam (XANAX) 0.25 MG tablet TAKE 1 TABLET BY MOUTH EVERY DAY AS NEEDED FOR ANXIETY   [DISCONTINUED] citalopram (CELEXA) 10 MG tablet TAKE 1 TABLET BY MOUTH EVERY DAY   [DISCONTINUED] rizatriptan (MAXALT) 10 MG tablet Take 1 tablet (10 mg total) by mouth every 2 (two) hours as needed for migraine.   [DISCONTINUED] amoxicillin-clavulanate (AUGMENTIN) 875-125 MG tablet Take 1 tablet by mouth 2 (two) times daily.   No facility-administered medications prior to visit.  Review of Systems  Constitutional:  Positive for fatigue.  Genitourinary:  Positive for frequency.  Neurological:  Positive for headaches.  Psychiatric/Behavioral:  The patient is nervous/anxious.      Objective    BP 92/60    Pulse 72    Resp 15    Ht 5\' 3"  (1.6 m)    Wt 133 lb 4.8 oz (60.5 kg)    LMP 07/31/2021 (Exact Date)    SpO2 99%    BMI 23.61 kg/m    Physical Exam Vitals and nursing note reviewed.  Constitutional:      General: She is awake. She is not in acute distress.    Appearance: Normal appearance. She is well-developed, well-groomed and normal weight. She is not  ill-appearing, toxic-appearing or diaphoretic.  HENT:     Head: Normocephalic and atraumatic.     Jaw: There is normal jaw occlusion. No trismus, tenderness, swelling or pain on movement.     Right Ear: Hearing, tympanic membrane, ear canal and external ear normal. There is no impacted cerumen.     Left Ear: Hearing, tympanic membrane, ear canal and external ear normal. There is no impacted cerumen.     Nose: Nose normal. No congestion or rhinorrhea.     Right Turbinates: Not enlarged, swollen or pale.     Left Turbinates: Not enlarged, swollen or pale.     Right Sinus: No maxillary sinus tenderness or frontal sinus tenderness.     Left Sinus: No maxillary sinus tenderness or frontal sinus tenderness.     Mouth/Throat:     Lips: Pink.     Mouth: Mucous membranes are moist. No injury.     Tongue: No lesions.     Pharynx: Oropharynx is clear. Uvula midline. No pharyngeal swelling, oropharyngeal exudate, posterior oropharyngeal erythema or uvula swelling.     Tonsils: No tonsillar exudate or tonsillar abscesses.  Eyes:     General: Lids are normal. Lids are everted, no foreign bodies appreciated. Vision grossly intact. Gaze aligned appropriately. No allergic shiner or visual field deficit.       Right eye: No discharge.        Left eye: No discharge.     Extraocular Movements: Extraocular movements intact.     Conjunctiva/sclera: Conjunctivae normal.     Right eye: Right conjunctiva is not injected. No exudate.    Left eye: Left conjunctiva is not injected. No exudate.    Pupils: Pupils are equal, round, and reactive to light.  Neck:     Thyroid: No thyroid mass, thyromegaly or thyroid tenderness.     Vascular: No carotid bruit.     Trachea: Trachea normal.  Cardiovascular:     Rate and Rhythm: Normal rate and regular rhythm.     Pulses: Normal pulses.          Carotid pulses are 2+ on the right side and 2+ on the left side.      Radial pulses are 2+ on the right side and 2+ on the  left side.       Dorsalis pedis pulses are 2+ on the right side and 2+ on the left side.       Posterior tibial pulses are 2+ on the right side and 2+ on the left side.     Heart sounds: Normal heart sounds, S1 normal and S2 normal. No murmur heard.   No friction rub. No gallop.  Pulmonary:     Effort: Pulmonary effort is normal. No respiratory distress.  Breath sounds: Normal breath sounds and air entry. No stridor. No wheezing, rhonchi or rales.  Chest:     Chest wall: No tenderness.     Comments: Breast exam deferred; discussed self exam Abdominal:     General: Abdomen is flat. Bowel sounds are normal. There is no distension.     Palpations: Abdomen is soft. There is no mass.     Tenderness: There is no abdominal tenderness. There is no right CVA tenderness, left CVA tenderness, guarding or rebound.     Hernia: No hernia is present.  Genitourinary:    Comments: Exam deferred; denies complaints Husband had vasectomy in 06/2021- current using condoms  Musculoskeletal:        General: No swelling, tenderness, deformity or signs of injury. Normal range of motion.     Cervical back: Full passive range of motion without pain, normal range of motion and neck supple. No edema, rigidity or tenderness. No muscular tenderness.     Right lower leg: No edema.     Left lower leg: No edema.  Lymphadenopathy:     Cervical: No cervical adenopathy.     Right cervical: No superficial, deep or posterior cervical adenopathy.    Left cervical: No superficial, deep or posterior cervical adenopathy.  Skin:    General: Skin is warm and dry.     Capillary Refill: Capillary refill takes less than 2 seconds.     Coloration: Skin is not jaundiced or pale.     Findings: No bruising, erythema, lesion or rash.  Neurological:     General: No focal deficit present.     Mental Status: She is alert and oriented to person, place, and time. Mental status is at baseline.     GCS: GCS eye subscore is 4. GCS  verbal subscore is 5. GCS motor subscore is 6.     Sensory: Sensation is intact. No sensory deficit.     Motor: Motor function is intact. No weakness.     Coordination: Coordination is intact. Coordination normal.     Gait: Gait is intact. Gait normal.  Psychiatric:        Attention and Perception: Attention and perception normal.        Mood and Affect: Mood and affect normal.        Speech: Speech normal.        Behavior: Behavior normal. Behavior is cooperative.        Thought Content: Thought content normal.        Cognition and Memory: Cognition and memory normal.        Judgment: Judgment normal.      Last depression screening scores PHQ 2/9 Scores 08/09/2021 07/27/2020 07/24/2019  PHQ - 2 Score 1 0 1  PHQ- 9 Score 4 - 3   Last fall risk screening No flowsheet data found. Last Audit-C alcohol use screening Alcohol Use Disorder Test (AUDIT) 08/09/2021  1. How often do you have a drink containing alcohol? 1  2. How many drinks containing alcohol do you have on a typical day when you are drinking? 0  3. How often do you have six or more drinks on one occasion? 0  AUDIT-C Score 1  Alcohol Brief Interventions/Follow-up -   A score of 3 or more in women, and 4 or more in men indicates increased risk for alcohol abuse, EXCEPT if all of the points are from question 1   No results found for any visits on 08/09/21.  Assessment & Plan  Routine Health Maintenance and Physical Exam  Exercise Activities and Dietary recommendations  Goals   None     Immunization History  Administered Date(s) Administered   Influenza,inj,Quad PF,6+ Mos 06/15/2015, 06/17/2017, 07/24/2019   Influenza-Unspecified 05/28/2018   Tdap 12/22/2013    Health Maintenance  Topic Date Due   COVID-19 Vaccine (1) Never done   Pneumococcal Vaccine 61-40 Years old (1 - PCV) Never done   INFLUENZA VACCINE  11/18/2021 (Originally 03/20/2021)   PAP SMEAR-Modifier  09/15/2022   TETANUS/TDAP  12/23/2023    Hepatitis C Screening  Completed   HIV Screening  Completed   HPV VACCINES  Aged Out    Discussed health benefits of physical activity, and encouraged her to engage in regular exercise appropriate for her age and condition.  Problem List Items Addressed This Visit       Cardiovascular and Mediastinum   Migraine with aura and without status migrainosus, not intractable (Chronic)    2 days per month occ weather relation Refill of medication provided Aware of triggers      Relevant Medications   citalopram (CELEXA) 10 MG tablet   rizatriptan (MAXALT) 10 MG tablet     Respiratory   Carcinoid tumor of left lung    11/2016 Completed at [facility] by Hervey Ard Yearly follow-up Denies concerns        Other   Anxiety    Use of PRN medication for breakthrough anxiety Aware of 6 month return to clinic for f/u given controlled substance Refill provided PDMP reviewed       Relevant Medications   citalopram (CELEXA) 10 MG tablet   ALPRAZolam (XANAX) 0.25 MG tablet   Abnormal albumin    Repeat chemistry panel      Relevant Orders   Comprehensive metabolic panel   GAD (generalized anxiety disorder)    Chronic, stable Some wax/wane r/t work/holidays Noted sleep difficulties d/t not being able to shut off her mind Continue celexa at this time Refill provided      Relevant Medications   citalopram (CELEXA) 10 MG tablet   ALPRAZolam (XANAX) 0.25 MG tablet   Psychophysiological insomnia    Stress r/t holidays Mom of 2 girls Works as [employer] Hx of anxiety On SSRI and has breakthrough benzo if needed- PDMP database reviewed      Annual physical exam - Primary    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  - Eat a low-fat diet with lots of fruits and vegetables, up to 7-9 servings per day.  -  Seatbelts can save your life. Wear them always.  - Smoke detectors on every level of your home, check batteries every year.  - Eye Doctor - have an eye exam every 1-2 years  - Safe sex - if you may be exposed to STDs, use a condom.  - Alcohol -  If you drink, do it moderately, less than 2 drinks per day.  - [place]. Choose someone to speak for you if you are not able.  - Depression is common in our stressful world.If you're feeling down or losing interest in things you normally enjoy, please come in for a visit.  - Violence - If anyone is threatening or hurting you, please call immediately.  No follow-ups on file.     Vonna Kotyk, FNP, have reviewed all documentation for this visit. The documentation on 08/09/21 for the exam, diagnosis, procedures, and orders are all accurate and complete.    Gwyneth Sprout, [place] [phone] (phone) [phone] (fax)  [place]

## 2021-08-10 LAB — COMPREHENSIVE METABOLIC PANEL
ALT: 8 IU/L (ref 0–32)
AST: 19 IU/L (ref 0–40)
Albumin/Globulin Ratio: 2.4 — ABNORMAL HIGH (ref 1.2–2.2)
Albumin: 4.5 g/dL (ref 3.8–4.8)
Alkaline Phosphatase: 61 IU/L (ref 44–121)
BUN/Creatinine Ratio: 14 (ref 9–23)
BUN: 10 mg/dL (ref 6–20)
Bilirubin Total: 0.6 mg/dL (ref 0.0–1.2)
CO2: 24 mmol/L (ref 20–29)
Calcium: 9.1 mg/dL (ref 8.7–10.2)
Chloride: 101 mmol/L (ref 96–106)
Creatinine, Ser: 0.69 mg/dL (ref 0.57–1.00)
Globulin, Total: 1.9 g/dL (ref 1.5–4.5)
Glucose: 83 mg/dL (ref 70–99)
Potassium: 4.6 mmol/L (ref 3.5–5.2)
Sodium: 137 mmol/L (ref 134–144)
Total Protein: 6.4 g/dL (ref 6.0–8.5)
eGFR: 115 mL/min/{1.73_m2} (ref >59)

## 2022-06-03 ENCOUNTER — Telehealth: Payer: BC Managed Care – PPO | Admitting: Physician Assistant

## 2022-06-03 DIAGNOSIS — J019 Acute sinusitis, unspecified: Secondary | ICD-10-CM

## 2022-06-03 DIAGNOSIS — B9689 Other specified bacterial agents as the cause of diseases classified elsewhere: Secondary | ICD-10-CM

## 2022-06-03 MED ORDER — AMOXICILLIN-POT CLAVULANATE 875-125 MG PO TABS: 1.0000 | ORAL_TABLET | Freq: Two times a day (BID) | ORAL | 0 refills | Status: DC

## 2022-06-03 NOTE — Progress Notes (Signed)
   Virtual Visit via Video Note   I, Valerie West, connected with  Valerie West  ([id number], 07-14-1986) on 06/03/22 at 10:45 AM EDT by a video-enabled telemedicine application and verified that I am speaking with the correct person using two identifiers.  Location: Patient: Home Provider: [place] office   I discussed the limitations of evaluation and management by telemedicine and the availability of in person appointments. The patient expressed understanding and agreed to proceed.    History of Present Illness: Valerie West is a 36 y.o. who identifies as a female who was assigned female at birth, and is being seen today for sinus infection sx.  Patient reports sinus congestion x 2 weeks. Has tried multiple OTC medications including alka seltzer and mucinex. Denies: fevers, chills, n/v/d, sick exposures, SOB, chest pain. She has taken a covid test and this is negative.  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   Anxiety 01/27/2015    Allergies: No Known Allergies Medications:  Current Outpatient Medications:    amoxicillin-clavulanate (AUGMENTIN) 875-125 MG tablet, Take 1 tablet by mouth 2 (two) times daily., Disp: 14 tablet, Rfl: 0   ALPRAZolam (XANAX) 0.25 MG tablet, Take 1 tablet (0.25 mg total) by mouth as needed for anxiety., Disp: 90 tablet, Rfl: 0   citalopram (CELEXA) 10 MG tablet, Take 1 tablet (10 mg total) by mouth daily., Disp: 90 tablet, Rfl: 3   Multiple Vitamin (MULTIVITAMIN) capsule, Take by mouth., Disp: , Rfl:    rizatriptan (MAXALT) 10 MG tablet, Take 1 tablet (10 mg total) by mouth every 2 (two) hours as needed for migraine., Disp: 12 tablet, Rfl:  4  Observations/Objective: Patient is well-developed, well-nourished in no acute distress.  Resting comfortably  at home.  Head is normocephalic, atraumatic.  No labored breathing.  Speech is clear and coherent with logical content.  Patient is alert and oriented at baseline.   Assessment and Plan: 1. Acute bacterial sinusitis No red flags Will treat with oral augmentin per orders Push fluids and rest If new/worsening sx, needs in office eval   Follow Up Instructions: I discussed the assessment and treatment plan with the patient. The patient was provided an opportunity to ask questions and all were answered. The patient agreed with the plan and demonstrated an understanding of the instructions.  A copy of instructions were sent to the patient via MyChart unless otherwise noted below.   The patient was advised to call back or seek an in-person evaluation if the symptoms worsen or if the condition fails to improve as anticipated.  Valerie West, [place]

## 2022-06-06 ENCOUNTER — Encounter: Payer: Self-pay | Admitting: Physician Assistant

## 2022-08-09 ENCOUNTER — Ambulatory Visit (INDEPENDENT_AMBULATORY_CARE_PROVIDER_SITE_OTHER): Payer: BC Managed Care – PPO | Admitting: Family Medicine

## 2022-08-09 ENCOUNTER — Encounter: Payer: Self-pay | Admitting: Family Medicine

## 2022-08-09 VITALS — BP 106/73 | HR 65 | Temp 97.8°F | Ht 63.0 in | Wt 132.8 lb

## 2022-08-09 DIAGNOSIS — F411 Generalized anxiety disorder: Secondary | ICD-10-CM | POA: Diagnosis not present

## 2022-08-09 DIAGNOSIS — Z23 Encounter for immunization: Secondary | ICD-10-CM | POA: Insufficient documentation

## 2022-08-09 DIAGNOSIS — G43109 Migraine with aura, not intractable, without status migrainosus: Secondary | ICD-10-CM | POA: Diagnosis not present

## 2022-08-09 DIAGNOSIS — F419 Anxiety disorder, unspecified: Secondary | ICD-10-CM | POA: Diagnosis not present

## 2022-08-09 DIAGNOSIS — Z0000 Encounter for general adult medical examination without abnormal findings: Secondary | ICD-10-CM

## 2022-08-09 DIAGNOSIS — T753XXA Motion sickness, initial encounter: Secondary | ICD-10-CM | POA: Insufficient documentation

## 2022-08-09 MED ORDER — ALPRAZOLAM 0.25 MG PO TABS: 0.2500 mg | ORAL_TABLET | ORAL | 0 refills | Status: DC | PRN

## 2022-08-09 MED ORDER — CITALOPRAM HYDROBROMIDE 10 MG PO TABS: 10.0000 mg | ORAL_TABLET | Freq: Every day | ORAL | 3 refills | Status: DC

## 2022-08-09 MED ORDER — RIZATRIPTAN BENZOATE 10 MG PO TABS: 10.0000 mg | ORAL_TABLET | ORAL | 4 refills | Status: DC | PRN

## 2022-08-09 MED ORDER — SCOPOLAMINE 1 MG/3DAYS TD PT72: 1.0000 | MEDICATED_PATCH | TRANSDERMAL | 0 refills | Status: DC

## 2022-08-09 NOTE — Assessment & Plan Note (Signed)
Chronic, stable 1-2 headaches/month Improved with medication Associated with hormone changes and vast weather changes

## 2022-08-09 NOTE — Assessment & Plan Note (Signed)
Chronic, stable Wishes to continue low dose PRN medication and daily controller medication to assist PDMP reviewed

## 2022-08-09 NOTE — Assessment & Plan Note (Signed)
Consented; VIS made available; no immediate side effects following administration; plan to repeat annually   

## 2022-08-09 NOTE — Assessment & Plan Note (Signed)
UTD on dental and vision Plan for mammogram at 38 Sees CT team at [facility] annually; 5 years clear. 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 Things to do to keep yourself healthy  - Exercise at least 30-45 minutes a day, 3-4 days a week.  - Eat a low-fat diet with lots of fruits and vegetables, up to 7-9 servings per day.  - Seatbelts can save your life. Wear them always.  - Smoke detectors on every level of your home, check batteries every year.  - Eye Doctor - have an eye exam every 1-2 years  - Safe sex - if you may be exposed to STDs, use a condom.  - Alcohol -  If you drink, do it moderately, less than 2 drinks per day.  - [place]. Choose someone to speak for you if you are not able.  - Depression is common in our stressful world.If you're feeling down or losing interest in things you normally enjoy, please come in for a visit.  - Violence - If anyone is threatening or hurting you, please call immediately.

## 2022-08-09 NOTE — Progress Notes (Signed)
I,Connie R Striblin,acting as a Education administrator for Gwyneth Sprout, FNP.,have documented all relevant documentation on the behalf of Gwyneth Sprout, FNP,as directed by  Gwyneth Sprout, FNP while in the presence of Gwyneth Sprout, FNP.   Complete physical exam   Patient: Valerie West   DOB: 12/12/1985   36 y.o. Female  MRN: [id number] Visit Date: 08/09/2022  Today's healthcare provider: Gwyneth Sprout, FNP  Re Introduced to nurse practitioner role and practice setting.  All questions answered.  Discussed provider/patient relationship and expectations.   Chief Complaint  Patient presents with   Annual Exam   Subjective    [place] is a 36 y.o. female who presents today for a complete physical exam.  She reports consuming a general diet. Gym/ health club routine includes cardio and weight kifting. She generally feels well. She reports sleeping fairly well. She does not have additional problems to discuss today.  HPI    Past Medical History:  Diagnosis Date   Anxiety    Complication of anesthesia    Depression during pregnancy, antepartum unk   PONV (postoperative nausea and vomiting)    Past Surgical History:  Procedure Laterality Date   ESOPHAGOGASTRODUODENOSCOPY (EGD) WITH PROPOFOL N/A 07/02/2019   Procedure: ESOPHAGOGASTRODUODENOSCOPY (EGD) WITH PROPOFOL;  Surgeon: Jonathon Bellows, MD;  Location: [facility] ENDOSCOPY;  Service: Gastroenterology;  Laterality: N/A;   LAPAROSCOPIC APPENDECTOMY N/A 06/23/2016   Procedure: APPENDECTOMY LAPAROSCOPIC;  Surgeon: Clayburn Pert, MD;  Location: [facility] ORS;  Service: General;  Laterality: N/A;   LOBECTOMY Left 12/07/2016   Social History   Socioeconomic History   Marital status: Married    Spouse name: Louie Casa   Number of children: 2   Years of education: 17   Highest education level: Not on file  Occupational History   Occupation: Product manager: [employer]  Tobacco Use   Smoking status: Never   Smokeless tobacco: Never  Substance and  Sexual Activity   Alcohol use: Yes    Alcohol/week: 0.0 standard drinks of alcohol    Comment: occassional   Drug use: No   Sexual activity: Not on file  Other Topics Concern   Not on file  Social History Narrative   Not on file   Social Determinants of Health   Financial Resource Strain: Not on file  Food Insecurity: Not on file  Transportation Needs: Not on file  Physical Activity: Not on file  Stress: Not on file  Social Connections: Not on file  Intimate Partner Violence: Not on file   Family Status  Relation Name Status   Father  Alive   MGM  Alive   MGF  Alive   Mother  Alive   Family History  Problem Relation Age of Onset   Depression Father    Bipolar disorder Father    Alcohol abuse Father    Drug abuse Father    Cancer Maternal Grandmother    Diabetes Maternal Grandmother    Diabetes Maternal Grandfather    Cancer Maternal Grandfather    Hypertension Mother    No Known Allergies  Patient Care Team: Gwyneth Sprout, FNP as PCP - General (Family Medicine)   Medications: Outpatient Medications Prior to Visit  Medication Sig   Multiple Vitamin (MULTIVITAMIN) capsule Take by mouth.   [DISCONTINUED] ALPRAZolam (XANAX) 0.25 MG tablet Take 1 tablet (0.25 mg total) by mouth as needed for anxiety.   [DISCONTINUED] amoxicillin-clavulanate (AUGMENTIN) 875-125 MG tablet Take 1 tablet by mouth  2 (two) times daily.   [DISCONTINUED] citalopram (CELEXA) 10 MG tablet Take 1 tablet (10 mg total) by mouth daily.   [DISCONTINUED] rizatriptan (MAXALT) 10 MG tablet Take 1 tablet (10 mg total) by mouth every 2 (two) hours as needed for migraine.   No facility-administered medications prior to visit.    Review of Systems  Neurological:  Positive for headaches.  All other systems reviewed and are negative.   Objective    BP 106/73 (BP Location: Right Arm, Patient Position: Sitting, Cuff Size: Normal)   Pulse 65   Temp 97.8 F (36.6 C) (Oral)   Ht '5\' 3"'$  (1.6 m)   Wt  132 lb 12.8 oz (60.2 kg)   SpO2 100%   BMI 23.52 kg/m   Physical Exam Vitals and nursing note reviewed.  Constitutional:      General: She is awake. She is not in acute distress.    Appearance: Normal appearance. She is well-developed, well-groomed and normal weight. She is not ill-appearing, toxic-appearing or diaphoretic.  HENT:     Head: Normocephalic and atraumatic.     Jaw: There is normal jaw occlusion. No trismus, tenderness, swelling or pain on movement.     Right Ear: Hearing, tympanic membrane, ear canal and external ear normal. There is no impacted cerumen.     Left Ear: Hearing, tympanic membrane, ear canal and external ear normal. There is no impacted cerumen.     Nose: Nose normal. No congestion or rhinorrhea.     Right Turbinates: Not enlarged, swollen or pale.     Left Turbinates: Not enlarged, swollen or pale.     Right Sinus: No maxillary sinus tenderness or frontal sinus tenderness.     Left Sinus: No maxillary sinus tenderness or frontal sinus tenderness.     Mouth/Throat:     Lips: Pink.     Mouth: Mucous membranes are moist. No injury.     Tongue: No lesions.     Pharynx: Oropharynx is clear. Uvula midline. No pharyngeal swelling, oropharyngeal exudate, posterior oropharyngeal erythema or uvula swelling.     Tonsils: No tonsillar exudate or tonsillar abscesses.  Eyes:     General: Lids are normal. Lids are everted, no foreign bodies appreciated. Vision grossly intact. Gaze aligned appropriately. No allergic shiner or visual field deficit.       Right eye: No discharge.        Left eye: No discharge.     Extraocular Movements: Extraocular movements intact.     Conjunctiva/sclera: Conjunctivae normal.     Right eye: Right conjunctiva is not injected. No exudate.    Left eye: Left conjunctiva is not injected. No exudate.    Pupils: Pupils are equal, round, and reactive to light.  Neck:     Thyroid: No thyroid mass, thyromegaly or thyroid tenderness.      Vascular: No carotid bruit.     Trachea: Trachea normal.  Cardiovascular:     Rate and Rhythm: Normal rate and regular rhythm.     Pulses: Normal pulses.          Carotid pulses are 2+ on the right side and 2+ on the left side.      Radial pulses are 2+ on the right side and 2+ on the left side.       Dorsalis pedis pulses are 2+ on the right side and 2+ on the left side.       Posterior tibial pulses are 2+ on the right side and 2+  on the left side.     Heart sounds: Normal heart sounds, S1 normal and S2 normal. No murmur heard.    No friction rub. No gallop.  Pulmonary:     Effort: Pulmonary effort is normal. No respiratory distress.     Breath sounds: Normal breath sounds and air entry. No stridor. No wheezing, rhonchi or rales.  Chest:     Chest wall: No tenderness.  Abdominal:     General: Abdomen is flat. Bowel sounds are normal. There is no distension.     Palpations: Abdomen is soft. There is no mass.     Tenderness: There is no abdominal tenderness. There is no right CVA tenderness, left CVA tenderness, guarding or rebound.     Hernia: No hernia is present.  Genitourinary:    Comments: Exam deferred; denies complaints Musculoskeletal:        General: No swelling, tenderness, deformity or signs of injury. Normal range of motion.     Cervical back: Full passive range of motion without pain, normal range of motion and neck supple. No edema, rigidity or tenderness. No muscular tenderness.     Right lower leg: No edema.     Left lower leg: No edema.  Lymphadenopathy:     Cervical: No cervical adenopathy.     Right cervical: No superficial, deep or posterior cervical adenopathy.    Left cervical: No superficial, deep or posterior cervical adenopathy.  Skin:    General: Skin is warm and dry.     Capillary Refill: Capillary refill takes less than 2 seconds.     Coloration: Skin is not jaundiced or pale.     Findings: No bruising, erythema, lesion or rash.  Neurological:      General: No focal deficit present.     Mental Status: She is alert and oriented to person, place, and time. Mental status is at baseline.     GCS: GCS eye subscore is 4. GCS verbal subscore is 5. GCS motor subscore is 6.     Sensory: Sensation is intact. No sensory deficit.     Motor: Motor function is intact. No weakness.     Coordination: Coordination is intact. Coordination normal.     Gait: Gait is intact. Gait normal.  Psychiatric:        Attention and Perception: Attention and perception normal.        Mood and Affect: Mood and affect normal.        Speech: Speech normal.        Behavior: Behavior normal. Behavior is cooperative.        Thought Content: Thought content normal.        Cognition and Memory: Cognition and memory normal.        Judgment: Judgment normal.      Last depression screening scores    08/09/2022   10:12 AM 08/09/2021   10:17 AM 07/27/2020    3:57 PM  PHQ 2/9 Scores  PHQ - 2 Score 0 1 0  PHQ- 9 Score 2 4    Last fall risk screening    08/09/2022   10:11 AM  [place] in the past year? 0  Number falls in past yr: 0  Injury with Fall? 0   Last Audit-C alcohol use screening    08/09/2022   10:12 AM  Alcohol Use Disorder Test (AUDIT)  1. How often do you have a drink containing alcohol? 1  2. How many drinks containing alcohol do  you have on a typical day when you are drinking? 0  3. How often do you have six or more drinks on one occasion? 0  AUDIT-C Score 1   A score of 3 or more in women, and 4 or more in men indicates increased risk for alcohol abuse, EXCEPT if all of the points are from question 1   No results found for any visits on 08/09/22.  Assessment & Plan    Routine Health Maintenance and Physical Exam  Exercise Activities and Dietary recommendations  Goals   None     Immunization History  Administered Date(s) Administered   Influenza,inj,Quad PF,6+ Mos 06/15/2015, 06/17/2017, 07/24/2019   Influenza-Unspecified  05/28/2018   Tdap 12/22/2013    Health Maintenance  Topic Date Due   COVID-19 Vaccine (1) Never done   INFLUENZA VACCINE  11/18/2022 (Originally 03/20/2022)   DTaP/Tdap/Td (2 - Td or Tdap) 12/23/2023   PAP SMEAR-Modifier  09/15/2024   Hepatitis C Screening  Completed   HIV Screening  Completed   HPV VACCINES  Aged Out    Discussed health benefits of physical activity, and encouraged her to engage in regular exercise appropriate for her age and condition.  Problem List Items Addressed This Visit       Cardiovascular and Mediastinum   Migraine with aura and without status migrainosus, not intractable (Chronic)    Chronic, stable 1-2 headaches/month Improved with medication Associated with hormone changes and vast weather changes       Relevant Medications   citalopram (CELEXA) 10 MG tablet   rizatriptan (MAXALT) 10 MG tablet     Other   Annual physical exam - Primary    UTD on dental and vision Plan for mammogram at 38 Sees CT team at [facility] annually; 5 years clear. 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 Things to do to keep yourself healthy  - Exercise at least 30-45 minutes a day, 3-4 days a week.  - Eat a low-fat diet with lots of fruits and vegetables, up to 7-9 servings per day.  - Seatbelts can save your life. Wear them always.  - Smoke detectors on every level of your home, check batteries every year.  - Eye Doctor - have an eye exam every 1-2 years  - Safe sex - if you may be exposed to STDs, use a condom.  - Alcohol -  If you drink, do it moderately, less than 2 drinks per day.  - [place]. Choose someone to speak for you if you are not able.  - Depression is common in our stressful world.If you're feeling down or losing interest in things you normally enjoy, please come in for a visit.  - Violence - If anyone is threatening or hurting you, please call immediately.       Relevant Orders    Comprehensive metabolic panel   CBC   TSH   Lipid panel   RESOLVED: Anxiety   Relevant Medications   ALPRAZolam (XANAX) 0.25 MG tablet   citalopram (CELEXA) 10 MG tablet   GAD (generalized anxiety disorder)    Chronic, stable Wishes to continue low dose PRN medication and daily controller medication to assist PDMP reviewed       Relevant Medications   ALPRAZolam (XANAX) 0.25 MG tablet   citalopram (CELEXA) 10 MG tablet   Motion sickness    Chronic, with driving and has an upcoming cruise Request for medication to  assist       Relevant Medications   scopolamine (TRANSDERM-SCOP) 1 MG/3DAYS   Need for influenza vaccination    Consented; VIS made available; no immediate side effects following administration; plan to repeat annually       Return in about 1 year (around 08/10/2023).    Vonna Kotyk, FNP, have reviewed all documentation for this visit. The documentation on 08/09/22 for the exam, diagnosis, procedures, and orders are all accurate and complete.  Gwyneth Sprout, [place] [phone] (phone) [phone] (fax)  [place]

## 2022-08-09 NOTE — Assessment & Plan Note (Signed)
Chronic, with driving and has an upcoming cruise Request for medication to assist

## 2022-08-10 NOTE — Progress Notes (Signed)
All labs are normal and stable.  Take care, Gwyneth Sprout, [address] [place], [place] [address] [phone] (phone) [phone] (fax) [place]

## 2022-08-15 LAB — COMPREHENSIVE METABOLIC PANEL
ALT: 8 IU/L (ref 0–32)
AST: 18 IU/L (ref 0–40)
Albumin/Globulin Ratio: 2.3 — ABNORMAL HIGH (ref 1.2–2.2)
Albumin: 4.5 g/dL (ref 3.9–4.9)
Alkaline Phosphatase: 65 IU/L (ref 44–121)
BUN/Creatinine Ratio: 21 (ref 9–23)
BUN: 15 mg/dL (ref 6–20)
Bilirubin Total: 0.7 mg/dL (ref 0.0–1.2)
CO2: 25 mmol/L (ref 20–29)
Calcium: 9.1 mg/dL (ref 8.7–10.2)
Chloride: 100 mmol/L (ref 96–106)
Creatinine, Ser: 0.73 mg/dL (ref 0.57–1.00)
Globulin, Total: 2 g/dL (ref 1.5–4.5)
Glucose: 85 mg/dL (ref 70–99)
Potassium: 4.4 mmol/L (ref 3.5–5.2)
Sodium: 140 mmol/L (ref 134–144)
Total Protein: 6.5 g/dL (ref 6.0–8.5)
eGFR: 108 mL/min/{1.73_m2} (ref >59)

## 2022-08-15 LAB — LIPID PANEL
Chol/HDL Ratio: 1.6 ratio (ref 0.0–4.4)
Cholesterol, Total: 118 mg/dL (ref 100–199)
HDL: 72 mg/dL (ref >39)
LDL Chol Calc (NIH): 38 mg/dL (ref 0–99)
Triglycerides: 22 mg/dL (ref 0–149)
VLDL Cholesterol Cal: 8 mg/dL (ref 5–40)

## 2022-08-15 LAB — CBC
Hematocrit: 41.3 % (ref 34.0–46.6)
Hemoglobin: 13.6 g/dL (ref 11.1–15.9)
MCH: 31.5 pg (ref 26.6–33.0)
MCHC: 32.9 g/dL (ref 31.5–35.7)
MCV: 96 fL (ref 79–97)
Platelets: 206 10*3/uL (ref 150–450)
RBC: 4.32 x10E6/uL (ref 3.77–5.28)
RDW: 11.7 % (ref 11.7–15.4)
WBC: 4.8 10*3/uL (ref 3.4–10.8)

## 2022-08-15 LAB — TSH: TSH: 2.6 u[IU]/mL (ref 0.450–4.500)

## 2022-08-21 ENCOUNTER — Encounter: Payer: Self-pay | Admitting: Dermatology

## 2022-08-21 ENCOUNTER — Ambulatory Visit: Payer: BC Managed Care – PPO | Admitting: Dermatology

## 2022-08-21 DIAGNOSIS — Z1283 Encounter for screening for malignant neoplasm of skin: Secondary | ICD-10-CM | POA: Diagnosis not present

## 2022-08-21 DIAGNOSIS — L821 Other seborrheic keratosis: Secondary | ICD-10-CM

## 2022-08-21 DIAGNOSIS — D2271 Melanocytic nevi of right lower limb, including hip: Secondary | ICD-10-CM

## 2022-08-21 DIAGNOSIS — D225 Melanocytic nevi of trunk: Secondary | ICD-10-CM

## 2022-08-21 DIAGNOSIS — L814 Other melanin hyperpigmentation: Secondary | ICD-10-CM

## 2022-08-21 DIAGNOSIS — D229 Melanocytic nevi, unspecified: Secondary | ICD-10-CM

## 2022-08-21 DIAGNOSIS — D2261 Melanocytic nevi of right upper limb, including shoulder: Secondary | ICD-10-CM | POA: Diagnosis not present

## 2022-08-21 DIAGNOSIS — Z808 Family history of malignant neoplasm of other organs or systems: Secondary | ICD-10-CM

## 2022-08-21 DIAGNOSIS — L578 Other skin changes due to chronic exposure to nonionizing radiation: Secondary | ICD-10-CM

## 2022-08-21 DIAGNOSIS — L988 Other specified disorders of the skin and subcutaneous tissue: Secondary | ICD-10-CM

## 2022-08-21 DIAGNOSIS — D2272 Melanocytic nevi of left lower limb, including hip: Secondary | ICD-10-CM

## 2022-08-21 DIAGNOSIS — D2262 Melanocytic nevi of left upper limb, including shoulder: Secondary | ICD-10-CM

## 2022-08-21 NOTE — Progress Notes (Signed)
New Patient Visit  Subjective  Valerie West is a 37 y.o. female who presents for the following: Annual Exam (No personal Hx of skin cancer or dysplastic nevi. Personal Hx of tanning bed use, not since 2007. Family Hx of MM, maternal grandfather).  The patient presents for Total-Body Skin Exam (TBSE) for skin cancer screening and mole check.  The patient has spots, moles and lesions to be evaluated, some may be new or changing and the patient has concerns that these could be cancer.   Review of Systems: No other skin or systemic complaints except as noted in HPI or Assessment and Plan.    Review of Systems: No other skin or systemic complaints except as noted in HPI or Assessment and Plan.   Objective  Well appearing patient in no apparent distress; mood and affect are within normal limits.  A full examination was performed including scalp, head, eyes, ears, nose, lips, neck, chest, axillae, abdomen, back, buttocks, bilateral upper extremities, bilateral lower extremities, hands, feet, fingers, toes, fingernails, and toenails. All findings within normal limits unless otherwise noted below.  Left Neck, Right Breast, Right Mid Back 1 mm dark brown macule right mid back  2 mm med-dark brown macule at right breast  4 mm light brown macule at left neck  face Rhytides and volume loss. Brown macules face   Assessment & Plan   Family history of skin cancer - what type(s): MM - who affected: Maternal Grandfather   Lentigines. Back, arms, legs, face - Scattered tan macules - Due to sun exposure - Benign-appearing, observe - Recommend daily broad spectrum sunscreen SPF 30+ to sun-exposed areas, reapply every 2 hours as needed. - Call for any changes  Seborrheic Keratoses - Stuck-on, waxy, tan-brown papules and/or plaques  - Benign-appearing - Discussed benign etiology and prognosis. - Observe - Call for any changes  Melanocytic Nevi. Back, arms, legs, abdomen - Tan-brown  and/or pink-flesh-colored symmetric macules and papules - Benign appearing on exam today - Observation - Call clinic for new or changing moles - Recommend daily use of broad spectrum spf 30+ sunscreen to sun-exposed areas.   Hemangiomas - Red papules - Discussed benign nature - Observe - Call for any changes  Actinic Damage. Chest - Chronic condition, secondary to cumulative UV/sun exposure - diffuse scaly erythematous macules with underlying dyspigmentation - Recommend daily broad spectrum sunscreen SPF 30+ to sun-exposed areas, reapply every 2 hours as needed.  - Staying in the shade or wearing long sleeves, sun glasses (UVA+UVB protection) and wide brim hats (4-inch brim around the entire circumference of the hat) are also recommended for sun protection.  - Call for new or changing lesions.  Skin cancer screening performed today.  Nevus (3) Left Neck; Right Breast; Right Mid Back  Benign-appearing.  Observation.  Call clinic for new or changing lesions.  Recommend daily use of broad spectrum spf 30+ sunscreen to sun-exposed areas.    Elastosis of skin face  Basic OTC daily skin care regimen to prevent photoaging:   Recommend facial moisturizer with sunscreen SPF 30 every morning (brands include CeraVe AM, Neutrogena, Eucerin, Cetaphil, Aveeno, La Roche Posay).  Can also apply a topical Vit C serum which is an antioxidant (brands include CeraVe, La Roche Posay, and The Ordinary) underneath sunscreen in morning. If you are outside during the day in the summer for extended periods, especially swimming and/or sweating, make sure you apply a water resistant facial sunscreen lotion spf 30 or higher.   At  night recommend a cream with retinol (a vitamin A derivative which stimulates collagen production) like CeraVe skin renewing retinol serum or ROC retinol correxion cream or Neutrogena rapid wrinkle repair cream. Retinol may cause skin irritation in people with sensitive skin.  Can use  it every other day and/or apply on top of a hyaluronic acid (HA) moisturizer/serum if better tolerated that way.  Retinol may also help with lightening age spots.   Our office sells high quality, medically tested skin care lines such as Elta MD sunscreens (with Zinc), and Alastin skin care products, which are very effective in treating photoaging. The Alastin line includes cosmeceutical grade Vit.C serum, HA serum, Elastin stimulating moisturizers/serums, lightening serum, and sunscreens.  If you want prescription treatment, then you would need an appointment (Rx tretinoin and fade creams, Botox, filler injections, laser treatments, etc.) These prescriptions and procedures are not covered by insurance but work very well.    Return in about 1 year (around 08/22/2023) for TBSE.   I, Emelia Salisbury, CMA, am acting as scribe for Brendolyn Patty, MD.  Documentation: I have reviewed the above documentation for accuracy and completeness, and I agree with the above.  Brendolyn Patty MD

## 2022-08-21 NOTE — Patient Instructions (Addendum)
Basic OTC daily skin care regimen to prevent photoaging:   Recommend facial moisturizer with sunscreen SPF 30 every morning (brands include CeraVe AM, Neutrogena, Eucerin, Cetaphil, Aveeno, La Roche Posay).  Can also apply a topical Vit C serum which is an antioxidant (brands include CeraVe, La Roche Posay, and The Ordinary) underneath sunscreen in morning. If you are outside during the day in the summer for extended periods, especially swimming and/or sweating, make sure you apply a water resistant facial sunscreen lotion spf 30 or higher.   At night recommend a cream with retinol (a vitamin A derivative which stimulates collagen production) like CeraVe skin renewing retinol serum or ROC retinol correxion cream or Neutrogena rapid wrinkle repair cream. Retinol may cause skin irritation in people with sensitive skin.  Can use it every other day and/or apply on top of a hyaluronic acid (HA) moisturizer/serum if better tolerated that way.  Retinol may also help with lightening age spots.   Our office sells high quality, medically tested skin care lines such as Elta MD sunscreens (with Zinc), and Alastin skin care products, which are very effective in treating photoaging. The Alastin line includes cosmeceutical grade Vit.C serum, HA serum, Elastin stimulating moisturizers/serums, lightening serum, and sunscreens.  If you want prescription treatment, then you would need an appointment (Rx tretinoin and fade creams, Botox, filler injections, laser treatments, etc.) These prescriptions and procedures are not covered by insurance but work very well.    Recommend daily broad spectrum sunscreen SPF 30+ to sun-exposed areas, reapply every 2 hours as needed. Call for new or changing lesions.  Staying in the shade or wearing long sleeves, sun glasses (UVA+UVB protection) and wide brim hats (4-inch brim around the entire circumference of the hat) are also recommended for sun protection.    Melanoma  ABCDEs  Melanoma is the most dangerous type of skin cancer, and is the leading cause of death from skin disease.  You are more likely to develop melanoma if you: Have light-colored skin, light-colored eyes, or red or blond hair Spend a lot of time in the sun Tan regularly, either outdoors or in a tanning bed Have had blistering sunburns, especially during childhood Have a close family member who has had a melanoma Have atypical moles or large birthmarks  Early detection of melanoma is key since treatment is typically straightforward and cure rates are extremely high if we catch it early.   The first sign of melanoma is often a change in a mole or a new dark spot.  The ABCDE system is a way of remembering the signs of melanoma.  A for asymmetry:  The two halves do not match. B for border:  The edges of the growth are irregular. C for color:  A mixture of colors are present instead of an even brown color. D for diameter:  Melanomas are usually (but not always) greater than 18m - the size of a pencil eraser. E for evolution:  The spot keeps changing in size, shape, and color.  Please check your skin once per month between visits. You can use a small mirror in front and a large mirror behind you to keep an eye on the back side or your body.   If you see any new or changing lesions before your next follow-up, please call to schedule a visit.  Please continue daily skin protection including broad spectrum sunscreen SPF 30+ to sun-exposed areas, reapplying every 2 hours as needed when you're outdoors.   Staying in the  shade or wearing long sleeves, sun glasses (UVA+UVB protection) and wide brim hats (4-inch brim around the entire circumference of the hat) are also recommended for sun protection.    Due to recent changes in healthcare laws, you may see results of your pathology and/or laboratory studies on MyChart before the doctors have had a chance to review them. We understand that in some  cases there may be results that are confusing or concerning to you. Please understand that not all results are received at the same time and often the doctors may need to interpret multiple results in order to provide you with the best plan of care or course of treatment. Therefore, we ask that you please give [place] 2 business days to thoroughly review all your results before contacting the office for clarification. Should we see a critical lab result, you will be contacted sooner.   If You Need Anything After Your Visit  If you have any questions or concerns for your doctor, please call our main line at [phone] and press option 4 to reach your doctor's medical assistant. If no one answers, please leave a voicemail as directed and we will return your call as soon as possible. Messages left after 4 pm will be answered the following business day.   You may also send [place] a message via [place]. We typically respond to MyChart messages within 1-2 business days.  For prescription refills, please ask your pharmacy to contact our office. Our fax number is [phone].  If you have an urgent issue when the clinic is closed that cannot wait until the next business day, you can page your doctor at the number below.    Please note that while we do our best to be available for urgent issues outside of office hours, we are not available 24/7.   If you have an urgent issue and are unable to reach [place], you may choose to seek medical care at your doctor's office, retail clinic, urgent care center, or emergency room.  If you have a medical emergency, please immediately call 911 or go to the emergency department.  Pager Numbers  - Dr. Nehemiah Massed: [phone]  - Dr. Laurence Ferrari: [phone]  - Dr. Nicole Kindred: [phone]  In the event of inclement weather, please call our main line at [phone] for an update on the status of any delays or closures.  Dermatology Medication Tips: Please keep the boxes that  topical medications come in in order to help keep track of the instructions about where and how to use these. Pharmacies typically print the medication instructions only on the boxes and not directly on the medication tubes.   If your medication is too expensive, please contact our office at [phone] option 4 or send [place] a message through [place].   We are unable to tell what your co-pay for medications will be in advance as this is different depending on your insurance coverage. However, we may be able to find a substitute medication at lower cost or fill out paperwork to get insurance to cover a needed medication.   If a prior authorization is required to get your medication covered by your insurance company, please allow [place] 1-2 business days to complete this process.  Drug prices often vary depending on where the prescription is filled and some pharmacies may offer cheaper prices.  The website www.goodrx.com contains coupons for medications through different pharmacies. The prices here do not account for what the cost may be with help from insurance (it may  be cheaper with your insurance), but the website can give you the price if you did not use any insurance.  - You can print the associated coupon and take it with your prescription to the pharmacy.  - You may also stop by our office during regular business hours and pick up a GoodRx coupon card.  - If you need your prescription sent electronically to a different pharmacy, notify our office through [facility] or by phone at [phone] option 4.     Si Usted Necesita Algo Despus de Su Visita  Tambin puede enviarnos un mensaje a travs de Pharmacist, community. Por lo general respondemos a los mensajes de MyChart en el transcurso de 1 a 2 das hbiles.  Para renovar recetas, por favor pida a su farmacia que se ponga en contacto con nuestra oficina. Harland Dingwall de fax es [place] [phone].  Si tiene un asunto urgente cuando la clnica  est cerrada y que no puede esperar hasta el siguiente da hbil, puede llamar/localizar a su doctor(a) al nmero que aparece a continuacin.   Por favor, tenga en cuenta que aunque hacemos todo lo posible para estar disponibles para asuntos urgentes fuera del horario de [place], no estamos disponibles las 24 horas del da, los 7 das de la [place].   Si tiene un problema urgente y no puede comunicarse con nosotros, puede optar por buscar atencin mdica  en el consultorio de su doctor(a), en una clnica privada, en un centro de atencin urgente o en una sala de emergencias.  Si tiene Engineering geologist, por favor llame inmediatamente al 911 o vaya a la sala de emergencias.  Nmeros de bper  - Dr. Nehemiah Massed: [phone]  - Dra. Moye: [phone]  - Dra. Nicole Kindred: [phone]  En caso de inclemencias del [place], por favor llame a Johnsie Kindred principal al [phone] para una actualizacin sobre el [place] de cualquier retraso o cierre.  Consejos para la medicacin en dermatologa: Por favor, guarde las cajas en las que vienen los medicamentos de uso tpico para ayudarle a seguir las instrucciones sobre dnde y cmo usarlos. Las farmacias generalmente imprimen las instrucciones del medicamento slo en las cajas y no directamente en los tubos del [place].   Si su medicamento es muy caro, por favor, pngase en contacto con Zigmund Daniel llamando al [phone] y presione la opcin 4 o envenos un mensaje a travs de Pharmacist, community.   No podemos decirle cul ser su copago por los medicamentos por adelantado ya que esto es diferente dependiendo de la cobertura de su seguro. Sin embargo, es posible que podamos encontrar un medicamento sustituto a Electrical engineer un formulario para que el seguro cubra el medicamento que se considera necesario.   Si se requiere una autorizacin previa para que su compaa de seguros [place] su medicamento, por favor permtanos de 1 a 2 das hbiles para  completar este proceso.  Los precios de los medicamentos varan con frecuencia dependiendo del Environmental consultant de dnde se surte la receta y alguna farmacias pueden ofrecer precios ms baratos.  El sitio web www.goodrx.com tiene cupones para medicamentos de Airline pilot. Los precios aqu no tienen en cuenta lo que podra costar con la ayuda del seguro (puede ser ms barato con su seguro), pero el sitio web puede darle el precio si no utiliz Research scientist (physical sciences).  - Puede imprimir el cupn correspondiente y llevarlo con su receta a la farmacia.  - Tambin puede pasar por nuestra oficina durante el horario de atencin regular y  recoger una tarjeta de cupones de GoodRx.  - Si necesita que su receta se enve electrnicamente a una farmacia diferente, informe a nuestra oficina a travs de MyChart de [place] o por telfono llamando al [phone] y presione la opcin 4.

## 2022-11-04 ENCOUNTER — Ambulatory Visit
Admission: EM | Admit: 2022-11-04 | Discharge: 2022-11-04 | Disposition: A | Discharge status: Home / Self Care | Payer: BC Managed Care – PPO | Attending: Internal Medicine | Admitting: Internal Medicine

## 2022-11-04 DIAGNOSIS — H6993 Unspecified Eustachian tube disorder, bilateral: Secondary | ICD-10-CM

## 2022-11-04 MED ORDER — PREDNISONE 20 MG PO TABS: 20.0000 mg | ORAL_TABLET | Freq: Every day | ORAL | 0 refills | Status: DC

## 2022-11-04 MED ORDER — MECLIZINE HCL 25 MG PO TABS: 25.0000 mg | ORAL_TABLET | Freq: Three times a day (TID) | ORAL | 0 refills | Status: DC | PRN

## 2022-11-04 NOTE — ED Triage Notes (Signed)
Patient presents to [facility] for bilateral ear pain since Friday. Treating with nasal spray and sudafed.

## 2022-11-04 NOTE — ED Provider Notes (Signed)
[facility]    CSN: [id number] Arrival date & time: 11/04/22  1052      History   Chief Complaint Chief Complaint  Patient presents with   Ear Fullness    Ear pain - Entered by patient    HPI Valerie West is a 37 y.o. female who presents with bilateral ear fullness pain down her neck on both sides x 2 days. Has been using her Flonase and Sudafed. Denies nose congestion or hx of allergies. Felt a little off balance this am.     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    Past Surgical History:  Procedure Laterality Date   ESOPHAGOGASTRODUODENOSCOPY (EGD) WITH PROPOFOL N/A 07/02/2019   Procedure: ESOPHAGOGASTRODUODENOSCOPY (EGD) WITH PROPOFOL;  Surgeon: Jonathon Bellows, MD;  Location: [facility] ENDOSCOPY;  Service: Gastroenterology;  Laterality: N/A;   LAPAROSCOPIC APPENDECTOMY N/A 06/23/2016   Procedure: APPENDECTOMY LAPAROSCOPIC;  Surgeon: Clayburn Pert, MD;  Location: [facility] ORS;  Service: General;  Laterality: N/A;   LOBECTOMY Left 12/07/2016    OB History     Gravida  2   Para  2   Term      Preterm      AB      Living         SAB      IAB      Ectopic      Multiple      Live Births               Home Medications    Prior to Admission medications   Medication Sig Start Date End Date Taking? Authorizing Provider  meclizine (ANTIVERT) 25 MG tablet Take 1 tablet (25 mg total) by mouth 3 (three) times daily as needed for dizziness. For 7 days 11/04/22   Yes Rodriguez-Southworth, Sunday Spillers, PA-C  predniSONE (DELTASONE) 20 MG tablet Take 1 tablet (20 mg total) by mouth daily with breakfast. 11/04/22  Yes Rodriguez-Southworth, Sunday Spillers, PA-C  ALPRAZolam (XANAX) 0.25 MG tablet Take 1 tablet (0.25 mg total) by mouth as needed for anxiety. 08/09/22   Gwyneth Sprout, FNP  citalopram (CELEXA) 10 MG tablet Take 1 tablet (10 mg total) by mouth daily. 08/09/22   Gwyneth Sprout, FNP  Multiple Vitamin (MULTIVITAMIN) capsule Take by mouth.    [provider]  rizatriptan (MAXALT) 10 MG tablet Take 1 tablet (10 mg total) by mouth every 2 (two) hours as needed for migraine. 08/09/22   Gwyneth Sprout, FNP  scopolamine (TRANSDERM-SCOP) 1 MG/3DAYS Place 1 patch (1.5 mg total) onto the skin every 3 (three) days. 08/09/22   Gwyneth Sprout, FNP    Family History Family History  Problem Relation Age of Onset   Depression Father    Bipolar disorder Father    Alcohol abuse Father    Drug abuse Father    Cancer Maternal Grandmother    Diabetes Maternal Grandmother    Diabetes Maternal Grandfather    Cancer Maternal Grandfather    Hypertension  Mother     Social History Social History   Tobacco Use   Smoking status: Never   Smokeless tobacco: Never  Substance Use Topics   Alcohol use: Yes    Alcohol/week: 0.0 standard drinks of alcohol    Comment: occassional   Drug use: No     Allergies   Patient has no known allergies.   Review of Systems Review of Systems  Neurological:  Positive for dizziness.       Had felt a little off balance this am  Otherwise as noted in HPI   Physical Exam Triage Vital Signs ED Triage Vitals [11/04/22 1130]  Enc Vitals Group     BP 104/69     Pulse Rate 71     Resp 18     Temp 97.8 F (36.6 C)     Temp Source Temporal     SpO2 97 %     Weight      Height      Head Circumference      Peak Flow      Pain Score 2     Pain Loc      Pain Edu?      Excl. in [place]?    No data found.  Updated Vital  Signs BP 104/69 (BP Location: Left Arm)   Pulse 71   Temp 97.8 F (36.6 C) (Temporal)   Resp 18   LMP 10/21/2022 (Approximate)   SpO2 97%   Visual Acuity Right Eye Distance:   Left Eye Distance:   Bilateral Distance:    Right Eye Near:   Left Eye Near:    Bilateral Near:     Physical Exam Vitals and nursing note reviewed.  Constitutional:      General: She is not in acute distress.    Appearance: She is normal weight. She is not toxic-appearing.  HENT:     Right Ear: Tympanic membrane and ear canal normal.     Left Ear: Tympanic membrane and ear canal normal. There is impacted cerumen.     Ears:     Comments: After lavage TM looks healthy    Nose: Nose normal.  Eyes:     General: No scleral icterus.    Conjunctiva/sclera: Conjunctivae normal.  Pulmonary:     Effort: Pulmonary effort is normal.  Musculoskeletal:        General: Normal range of motion.     Cervical back: Neck supple.  Lymphadenopathy:     Cervical: No cervical adenopathy.  Skin:    General: Skin is warm and dry.  Neurological:     Mental Status: She is alert and oriented to person, place, and time.     Gait: Gait normal.  Psychiatric:        Mood and Affect: Mood normal.        Behavior: Behavior normal.     UC Treatments / Results  Labs (all labs ordered are listed, but only abnormal results are displayed) Labs Reviewed - No data to display  EKG   Radiology No results found.  Procedures Procedures (including critical care time)  Medications Ordered in UC Medications - No data to display  Initial Impression / Assessment and Plan / UC Course  I have reviewed the triage vital signs and the nursing notes. L ear lavage done and wax was able to be flushed out  Bilateral [place] Tube dysfunction  I placed her on Prednisone and Meclizine as noted.   Final Clinical Impressions(s) / UC  Diagnoses   Final diagnoses:  Eustachian tube dysfunction, bilateral   Discharge Instructions    None    ED Prescriptions     Medication Sig Dispense Auth. Provider   predniSONE (DELTASONE) 20 MG tablet Take 1 tablet (20 mg total) by mouth daily with breakfast. 5 tablet Rodriguez-Southworth, Sunday Spillers, PA-C   meclizine (ANTIVERT) 25 MG tablet Take 1 tablet (25 mg total) by mouth 3 (three) times daily as needed for dizziness. For 7 days 21 tablet Rodriguez-Southworth, Sunday Spillers, [place]      PDMP not reviewed this encounter.   Shelby Mattocks, Hershal Coria 11/04/22 1228

## 2023-01-17 ENCOUNTER — Encounter: Payer: Self-pay | Admitting: Family Medicine

## 2023-06-11 DIAGNOSIS — M722 Plantar fascial fibromatosis: Secondary | ICD-10-CM | POA: Insufficient documentation

## 2023-06-11 DIAGNOSIS — M76829 Posterior tibial tendinitis, unspecified leg: Secondary | ICD-10-CM | POA: Insufficient documentation

## 2023-08-12 ENCOUNTER — Encounter: Payer: Self-pay | Admitting: Family Medicine

## 2023-08-12 ENCOUNTER — Ambulatory Visit (INDEPENDENT_AMBULATORY_CARE_PROVIDER_SITE_OTHER): Payer: BC Managed Care – PPO | Admitting: Family Medicine

## 2023-08-12 VITALS — BP 102/70 | HR 77 | Ht 63.0 in | Wt 138.0 lb

## 2023-08-12 DIAGNOSIS — G43109 Migraine with aura, not intractable, without status migrainosus: Secondary | ICD-10-CM

## 2023-08-12 DIAGNOSIS — Z0000 Encounter for general adult medical examination without abnormal findings: Secondary | ICD-10-CM | POA: Diagnosis not present

## 2023-08-12 DIAGNOSIS — F411 Generalized anxiety disorder: Secondary | ICD-10-CM

## 2023-08-12 MED ORDER — RIZATRIPTAN BENZOATE 10 MG PO TABS: 10.0000 mg | ORAL_TABLET | ORAL | 4 refills | Status: DC | PRN

## 2023-08-12 MED ORDER — CITALOPRAM HYDROBROMIDE 10 MG PO TABS: 10.0000 mg | ORAL_TABLET | Freq: Every day | ORAL | 3 refills | Status: DC

## 2023-08-12 MED ORDER — ALPRAZOLAM 0.25 MG PO TABS: 0.2500 mg | ORAL_TABLET | ORAL | 0 refills | Status: DC | PRN

## 2023-08-12 NOTE — Progress Notes (Signed)
Complete physical exam   Patient: Valerie West   DOB: [birth year]   37 y.o. Female  MRN: [id number] Visit Date: 08/12/2023  Today's healthcare provider: Jacky Kindle, FNP  Introduced to nurse practitioner role and practice setting.  All questions answered.  Discussed provider/patient relationship and expectations.   Chief Complaint  Patient presents with   Annual Exam   Subjective    Valerie West is a 37 y.o. female who presents today for a complete physical exam.  She reports consuming a general diet. Gym/ health club routine includes group fitness classes 3-4 times/week, 45 mins each. She generally feels fairly well. She reports sleeping fairly well. She does have additional problems to discuss today.   HPI  Patient continues to follow at [facility] for rare hx of lung cancer; treated in 2018. Remains under the care of Dr Merrie Roof and his team.  Past Medical History:  Diagnosis Date   Anxiety    Complication of anesthesia    Depression during pregnancy, antepartum unk   PONV (postoperative nausea and vomiting)    Past Surgical History:  Procedure Laterality Date   ESOPHAGOGASTRODUODENOSCOPY (EGD) WITH PROPOFOL N/A 07/02/2019   Procedure: ESOPHAGOGASTRODUODENOSCOPY (EGD) WITH PROPOFOL;  Surgeon: Wyline Mood, MD;  Location: [facility] ENDOSCOPY;  Service: Gastroenterology;  Laterality: N/A;   LAPAROSCOPIC APPENDECTOMY N/A 06/23/2016   Procedure: APPENDECTOMY LAPAROSCOPIC;  Surgeon: Ricarda Frame, MD;  Location: [facility] ORS;  Service: General;  Laterality: N/A;   LOBECTOMY Left 12/07/2016   Social History   Socioeconomic History   Marital status: Married    Spouse name: Valerie West   Number of children: 2   Years of education: 17   Highest education level: Not on file  Occupational History   Occupation: Magazine features editor: [employer]  Tobacco Use   Smoking status: Never   Smokeless tobacco: Never  Substance and Sexual Activity   Alcohol use: Yes     Alcohol/week: 0.0 standard drinks of alcohol    Comment: occassional   Drug use: No   Sexual activity: Not on file  Other Topics Concern   Not on file  Social History Narrative   Not on file   Social Drivers of Health   Financial Resource Strain: Not on file  Food Insecurity: Not on file  Transportation Needs: Not on file  Physical Activity: Not on file  Stress: Not on file  Social Connections: Not on file  Intimate Partner Violence: Not on file   Family Status  Relation Name Status   Father  Alive   MGM  Alive   MGF  Alive   Mother  Alive  No partnership data on file   Family History  Problem Relation Age of Onset   Depression Father    Bipolar disorder Father    Alcohol abuse Father    Drug abuse Father    Cancer Maternal Grandmother    Diabetes Maternal Grandmother    Diabetes Maternal Grandfather    Cancer Maternal Grandfather    Hypertension Mother    No Known Allergies  Patient Care Team: Jacky Kindle, FNP as PCP - General (Family Medicine)   Medications: Outpatient Medications Prior to Visit  Medication Sig   Multiple Vitamin (MULTIVITAMIN) capsule Take by mouth.   [DISCONTINUED] ALPRAZolam (XANAX) 0.25 MG tablet Take 1 tablet (0.25 mg total) by mouth as needed for anxiety.   [DISCONTINUED] citalopram (CELEXA) 10 MG tablet Take 1 tablet (10 mg total) by  mouth daily.   [DISCONTINUED] rizatriptan (MAXALT) 10 MG tablet Take 1 tablet (10 mg total) by mouth every 2 (two) hours as needed for migraine.   [DISCONTINUED] meclizine (ANTIVERT) 25 MG tablet Take 1 tablet (25 mg total) by mouth 3 (three) times daily as needed for dizziness. For 7 days   [DISCONTINUED] predniSONE (DELTASONE) 20 MG tablet Take 1 tablet (20 mg total) by mouth daily with breakfast.   [DISCONTINUED] scopolamine (TRANSDERM-SCOP) 1 MG/3DAYS Place 1 patch (1.5 mg total) onto the skin every 3 (three) days.   No facility-administered medications prior to visit.    Review of Systems Last  CBC Lab Results  Component Value Date   WBC 4.8 08/12/2023   HGB 14.1 08/12/2023   HCT 43.3 08/12/2023   MCV 98 (H) 08/12/2023   MCH 31.8 08/12/2023   RDW 11.7 08/12/2023   PLT 214 08/12/2023   Last metabolic panel Lab Results  Component Value Date   GLUCOSE 87 08/12/2023   NA 140 08/12/2023   K 4.4 08/12/2023   CL 104 08/12/2023   CO2 24 08/12/2023   BUN 10 08/12/2023   CREATININE 0.81 08/12/2023   EGFR 95 08/12/2023   CALCIUM 9.1 08/12/2023   PROT 6.4 08/12/2023   ALBUMIN 4.6 08/12/2023   LABGLOB 1.8 08/12/2023   AGRATIO 2.3 (H) 08/09/2022   BILITOT 0.6 08/12/2023   ALKPHOS 53 08/12/2023   AST 16 08/12/2023   ALT 11 08/12/2023   ANIONGAP 6 06/23/2016   Last lipids Lab Results  Component Value Date   CHOL 129 08/12/2023   HDL 75 08/12/2023   LDLCALC 45 08/12/2023   TRIG 31 08/12/2023   CHOLHDL 1.7 08/12/2023   Last hemoglobin A1c Lab Results  Component Value Date   HGBA1C 5.3 07/24/2019   Last thyroid functions Lab Results  Component Value Date   TSH 3.220 08/12/2023   Last vitamin D Lab Results  Component Value Date   VD25OH 33 06/27/2017   Last vitamin B12 and Folate Lab Results  Component Value Date   VITAMINB12 343 07/24/2019    Objective    BP 102/70 (BP Location: Left Arm, Patient Position: Sitting, Cuff Size: Normal)   Pulse 77   Ht 5\' 3"  (1.6 m)   Wt 138 lb (62.6 kg)   LMP 07/28/2023   BMI 24.45 kg/m   BP Readings from Last 3 Encounters:  08/12/23 102/70  11/04/22 104/69  08/09/22 106/73   Wt Readings from Last 3 Encounters:  08/12/23 138 lb (62.6 kg)  08/09/22 132 lb 12.8 oz (60.2 kg)  08/09/21 133 lb 4.8 oz (60.5 kg)   SpO2 Readings from Last 3 Encounters:  11/04/22 97%  08/09/22 100%  08/09/21 99%   Physical Exam Vitals and nursing note reviewed.  Constitutional:      General: She is awake. She is not in acute distress.    Appearance: Normal appearance. She is well-developed, well-groomed and normal weight. She is  not ill-appearing, toxic-appearing or diaphoretic.  HENT:     Head: Normocephalic and atraumatic.     Jaw: There is normal jaw occlusion. No trismus, tenderness, swelling or pain on movement.     Right Ear: Hearing, tympanic membrane, ear canal and external ear normal. There is no impacted cerumen.     Left Ear: Hearing, tympanic membrane, ear canal and external ear normal. There is no impacted cerumen.     Nose: Nose normal. No congestion or rhinorrhea.     Right Turbinates: Not enlarged, swollen or  pale.     Left Turbinates: Not enlarged, swollen or pale.     Right Sinus: No maxillary sinus tenderness or frontal sinus tenderness.     Left Sinus: No maxillary sinus tenderness or frontal sinus tenderness.     Mouth/Throat:     Lips: Pink.     Mouth: Mucous membranes are moist. No injury.     Tongue: No lesions.     Pharynx: Oropharynx is clear. Uvula midline. No pharyngeal swelling, oropharyngeal exudate, posterior oropharyngeal erythema or uvula swelling.     Tonsils: No tonsillar exudate or tonsillar abscesses.  Eyes:     General: Lids are normal. Lids are everted, no foreign bodies appreciated. Vision grossly intact. Gaze aligned appropriately. No allergic shiner or visual field deficit.       Right eye: No discharge.        Left eye: No discharge.     Extraocular Movements: Extraocular movements intact.     Conjunctiva/sclera: Conjunctivae normal.     Right eye: Right conjunctiva is not injected. No exudate.    Left eye: Left conjunctiva is not injected. No exudate.    Pupils: Pupils are equal, round, and reactive to light.  Neck:     Thyroid: No thyroid mass, thyromegaly or thyroid tenderness.     Vascular: No carotid bruit.     Trachea: Trachea normal.  Cardiovascular:     Rate and Rhythm: Normal rate and regular rhythm.     Pulses: Normal pulses.          Carotid pulses are 2+ on the right side and 2+ on the left side.      Radial pulses are 2+ on the right side and 2+ on the  left side.       Dorsalis pedis pulses are 2+ on the right side and 2+ on the left side.       Posterior tibial pulses are 2+ on the right side and 2+ on the left side.     Heart sounds: Normal heart sounds, S1 normal and S2 normal. No murmur heard.    No friction rub. No gallop.  Pulmonary:     Effort: Pulmonary effort is normal. No respiratory distress.     Breath sounds: Normal breath sounds and air entry. No stridor. No wheezing, rhonchi or rales.  Chest:     Chest wall: No tenderness.  Abdominal:     General: Abdomen is flat. Bowel sounds are normal. There is no distension.     Palpations: Abdomen is soft. There is no mass.     Tenderness: There is no abdominal tenderness. There is no right CVA tenderness, left CVA tenderness, guarding or rebound.     Hernia: No hernia is present.  Genitourinary:    Comments: Exam deferred; denies complaints Musculoskeletal:        General: No swelling, tenderness, deformity or signs of injury. Normal range of motion.     Cervical back: Full passive range of motion without pain, normal range of motion and neck supple. No edema, rigidity or tenderness. No muscular tenderness.     Right lower leg: No edema.     Left lower leg: No edema.  Lymphadenopathy:     Cervical: No cervical adenopathy.     Right cervical: No superficial, deep or posterior cervical adenopathy.    Left cervical: No superficial, deep or posterior cervical adenopathy.  Skin:    General: Skin is warm and dry.     Capillary Refill: Capillary refill  takes less than 2 seconds.     Coloration: Skin is not jaundiced or pale.     Findings: No bruising, erythema, lesion or rash.  Neurological:     General: No focal deficit present.     Mental Status: She is alert and oriented to person, place, and time. Mental status is at baseline.     GCS: GCS eye subscore is 4. GCS verbal subscore is 5. GCS motor subscore is 6.     Sensory: Sensation is intact. No sensory deficit.     Motor: Motor  function is intact. No weakness.     Coordination: Coordination is intact. Coordination normal.     Gait: Gait is intact. Gait normal.  Psychiatric:        Attention and Perception: Attention and perception normal.        Mood and Affect: Mood and affect normal.        Speech: Speech normal.        Behavior: Behavior normal. Behavior is cooperative.        Thought Content: Thought content normal.        Cognition and Memory: Cognition and memory normal.        Judgment: Judgment normal.     Last depression screening scores    08/12/2023    8:46 AM 08/09/2022   10:12 AM 08/09/2021   10:17 AM  PHQ 2/9 Scores  PHQ - 2 Score 1 0 1  PHQ- 9 Score 4 2 4    Last fall risk screening    08/09/2022   10:11 AM  Fall Risk   Falls in the past year? 0  Number falls in past yr: 0  Injury with Fall? 0   Last Audit-C alcohol use screening    08/09/2022   10:12 AM  Alcohol Use Disorder Test (AUDIT)  1. How often do you have a drink containing alcohol? 1  2. How many drinks containing alcohol do you have on a typical day when you are drinking? 0  3. How often do you have six or more drinks on one occasion? 0  AUDIT-C Score 1   A score of 3 or more in women, and 4 or more in men indicates increased risk for alcohol abuse, EXCEPT if all of the points are from question 1   Results for orders placed or performed in visit on 08/12/23  CBC with Differential/Platelet  Result Value Ref Range   WBC 4.8 3.4 - 10.8 x10E3/uL   RBC 4.43 3.77 - 5.28 x10E6/uL   Hemoglobin 14.1 11.1 - 15.9 g/dL   Hematocrit 16.1 09.6 - 46.6 %   MCV 98 (H) 79 - 97 fL   MCH 31.8 26.6 - 33.0 pg   MCHC 32.6 31.5 - 35.7 g/dL   RDW 04.5 40.9 - 81.1 %   Platelets 214 150 - 450 x10E3/uL   Neutrophils 47 Not Estab. %   Lymphs 39 Not Estab. %   Monocytes 9 Not Estab. %   Eos 4 Not Estab. %   Basos 1 Not Estab. %   Neutrophils Absolute 2.3 1.4 - 7.0 x10E3/uL   Lymphocytes Absolute 1.9 0.7 - 3.1 x10E3/uL   Monocytes  Absolute 0.4 0.1 - 0.9 x10E3/uL   EOS (ABSOLUTE) 0.2 0.0 - 0.4 x10E3/uL   Basophils Absolute 0.0 0.0 - 0.2 x10E3/uL   Immature Granulocytes 0 Not Estab. %   Immature Grans (Abs) 0.0 0.0 - 0.1 x10E3/uL  Comprehensive metabolic panel  Result Value Ref Range  Glucose 87 70 - 99 mg/dL   BUN 10 6 - 20 mg/dL   Creatinine, Ser 7.82 0.57 - 1.00 mg/dL   eGFR 95 >95 AO/ZHY/8.65   BUN/Creatinine Ratio 12 9 - 23   Sodium 140 134 - 144 mmol/L   Potassium 4.4 3.5 - 5.2 mmol/L   Chloride 104 96 - 106 mmol/L   CO2 24 20 - 29 mmol/L   Calcium 9.1 8.7 - 10.2 mg/dL   Total Protein 6.4 6.0 - 8.5 g/dL   Albumin 4.6 3.9 - 4.9 g/dL   Globulin, Total 1.8 1.5 - 4.5 g/dL   Bilirubin Total 0.6 0.0 - 1.2 mg/dL   Alkaline Phosphatase 53 44 - 121 IU/L   AST 16 0 - 40 IU/L   ALT 11 0 - 32 IU/L  TSH  Result Value Ref Range   TSH 3.220 0.450 - 4.500 uIU/mL  Lipid panel  Result Value Ref Range   Cholesterol, Total 129 100 - 199 mg/dL   Triglycerides 31 0 - 149 mg/dL   HDL 75 >78 mg/dL   VLDL Cholesterol Cal 9 5 - 40 mg/dL   LDL Chol Calc (NIH) 45 0 - 99 mg/dL   Chol/HDL Ratio 1.7 0.0 - 4.4 ratio    Assessment & Plan    Routine Health Maintenance and Physical Exam  Exercise Activities and Dietary recommendations  Goals   None     Immunization History  Administered Date(s) Administered   Influenza,inj,Quad PF,6+ Mos 06/15/2015, 06/17/2017, 07/24/2019, 08/09/2022   Influenza-Unspecified 05/28/2018   Tdap 12/22/2013    Health Maintenance  Topic Date Due   COVID-19 Vaccine (1) Never done   Cervical Cancer Screening (HPV/Pap Cotest)  09/15/2022   INFLUENZA VACCINE  11/18/2023 (Originally 03/21/2023)   DTaP/Tdap/Td (2 - Td or Tdap) 12/23/2023   Hepatitis C Screening  Completed   HIV Screening  Completed   HPV VACCINES  Aged Out    Discussed health benefits of physical activity, and encouraged her to engage in regular exercise appropriate for her age and condition.  Problem List Items  Addressed This Visit       Cardiovascular and Mediastinum   Migraine with aura and without status migrainosus, not intractable (Chronic)   Chronic, stable Request for refills      Relevant Medications   citalopram (CELEXA) 10 MG tablet   rizatriptan (MAXALT) 10 MG tablet     Other   Annual physical exam - Primary   Things to do to keep yourself healthy  - Exercise at least 30-45 minutes a day, 3-4 days a week.  - Eat a low-fat diet with lots of fruits and vegetables, up to 7-9 servings per day.  - Seatbelts can save your life. Wear them always.  - Smoke detectors on every level of your home, check batteries every year.  - Eye Doctor - have an eye exam every 1-2 years  - Safe sex - if you may be exposed to STDs, use a condom.  - Alcohol -  If you drink, do it moderately, less than 2 drinks per day.  - Health Care Power of Attorney. Choose someone to speak for you if you are not able.  - Depression is common in our stressful world.If you're feeling down or losing interest in things you normally enjoy, please come in for a visit.  - Violence - If anyone is threatening or hurting you, please call immediately.       Relevant Orders   CBC with Differential/Platelet (Completed)  Comprehensive metabolic panel (Completed)   TSH (Completed)   Lipid panel (Completed)   GAD (generalized anxiety disorder)   Chronic, stable Request to continue low dose xanax for flares and daily controller of celexa 10 mg    08/12/2023    8:46 AM  GAD 7 : Generalized Anxiety Score  Nervous, Anxious, on Edge 1  Control/stop worrying 1  Worry too much - different things 1  Trouble relaxing 1  Restless 1  Easily annoyed or irritable 1  Afraid - awful might happen 1  Total GAD 7 Score 7  Anxiety Difficulty Somewhat difficult       08/12/2023    8:46 AM 08/09/2022   10:12 AM 08/09/2021   10:17 AM  PHQ9 SCORE ONLY  PHQ-9 Total Score 4 2 4    Contracted for safety; denies concerns for SI or  HI      Relevant Medications   ALPRAZolam (XANAX) 0.25 MG tablet   citalopram (CELEXA) 10 MG tablet   Return in about 1 year (around 08/11/2024) for annual examination.    Leilani Merl, FNP, have reviewed all documentation for this visit. The documentation on 08/17/23 for the exam, diagnosis, procedures, and orders are all accurate and complete.  Jacky Kindle, FNP  [facility] [phone] (phone) [phone] (fax)  [facility]

## 2023-08-13 LAB — CBC WITH DIFFERENTIAL/PLATELET
Basophils Absolute: 0 10*3/uL (ref 0.0–0.2)
Basos: 1 %
EOS (ABSOLUTE): 0.2 10*3/uL (ref 0.0–0.4)
Eos: 4 %
Hematocrit: 43.3 % (ref 34.0–46.6)
Hemoglobin: 14.1 g/dL (ref 11.1–15.9)
Immature Grans (Abs): 0 10*3/uL (ref 0.0–0.1)
Immature Granulocytes: 0 %
Lymphocytes Absolute: 1.9 10*3/uL (ref 0.7–3.1)
Lymphs: 39 %
MCH: 31.8 pg (ref 26.6–33.0)
MCHC: 32.6 g/dL (ref 31.5–35.7)
MCV: 98 fL — ABNORMAL HIGH (ref 79–97)
Monocytes Absolute: 0.4 10*3/uL (ref 0.1–0.9)
Monocytes: 9 %
Neutrophils Absolute: 2.3 10*3/uL (ref 1.4–7.0)
Neutrophils: 47 %
Platelets: 214 10*3/uL (ref 150–450)
RBC: 4.43 x10E6/uL (ref 3.77–5.28)
RDW: 11.7 % (ref 11.7–15.4)
WBC: 4.8 10*3/uL (ref 3.4–10.8)

## 2023-08-13 LAB — COMPREHENSIVE METABOLIC PANEL
ALT: 11 [IU]/L (ref 0–32)
AST: 16 [IU]/L (ref 0–40)
Albumin: 4.6 g/dL (ref 3.9–4.9)
Alkaline Phosphatase: 53 [IU]/L (ref 44–121)
BUN/Creatinine Ratio: 12 (ref 9–23)
BUN: 10 mg/dL (ref 6–20)
Bilirubin Total: 0.6 mg/dL (ref 0.0–1.2)
CO2: 24 mmol/L (ref 20–29)
Calcium: 9.1 mg/dL (ref 8.7–10.2)
Chloride: 104 mmol/L (ref 96–106)
Creatinine, Ser: 0.81 mg/dL (ref 0.57–1.00)
Globulin, Total: 1.8 g/dL (ref 1.5–4.5)
Glucose: 87 mg/dL (ref 70–99)
Potassium: 4.4 mmol/L (ref 3.5–5.2)
Sodium: 140 mmol/L (ref 134–144)
Total Protein: 6.4 g/dL (ref 6.0–8.5)
eGFR: 95 mL/min/{1.73_m2} (ref >59)

## 2023-08-13 LAB — LIPID PANEL
Chol/HDL Ratio: 1.7 {ratio} (ref 0.0–4.4)
Cholesterol, Total: 129 mg/dL (ref 100–199)
HDL: 75 mg/dL (ref >39)
LDL Chol Calc (NIH): 45 mg/dL (ref 0–99)
Triglycerides: 31 mg/dL (ref 0–149)
VLDL Cholesterol Cal: 9 mg/dL (ref 5–40)

## 2023-08-13 LAB — TSH: TSH: 3.22 u[IU]/mL (ref 0.450–4.500)

## 2023-08-17 NOTE — Assessment & Plan Note (Signed)
Chronic, stable Request to continue low dose xanax for flares and daily controller of celexa 10 mg    08/12/2023    8:46 AM  GAD 7 : Generalized Anxiety Score  Nervous, Anxious, on Edge 1  Control/stop worrying 1  Worry too much - different things 1  Trouble relaxing 1  Restless 1  Easily annoyed or irritable 1  Afraid - awful might happen 1  Total GAD 7 Score 7  Anxiety Difficulty Somewhat difficult       08/12/2023    8:46 AM 08/09/2022   10:12 AM 08/09/2021   10:17 AM  PHQ9 SCORE ONLY  PHQ-9 Total Score 4 2 4    Contracted for safety; denies concerns for SI or HI

## 2023-08-17 NOTE — Assessment & Plan Note (Signed)

## 2023-08-17 NOTE — Assessment & Plan Note (Signed)
Chronic, stable Request for refills

## 2023-08-27 ENCOUNTER — Ambulatory Visit: Payer: 59 | Admitting: Family Medicine

## 2023-08-27 ENCOUNTER — Ambulatory Visit: Payer: BC Managed Care – PPO | Admitting: Dermatology

## 2023-08-27 ENCOUNTER — Encounter: Payer: Self-pay | Admitting: Family Medicine

## 2023-08-27 ENCOUNTER — Ambulatory Visit: Payer: Self-pay

## 2023-08-27 VITALS — BP 116/72 | HR 95 | Temp 98.0°F | Resp 18 | Ht 63.0 in | Wt 140.9 lb

## 2023-08-27 DIAGNOSIS — K2900 Acute gastritis without bleeding: Secondary | ICD-10-CM

## 2023-08-27 MED ORDER — PANTOPRAZOLE SODIUM 40 MG PO TBEC: 40.0000 mg | DELAYED_RELEASE_TABLET | Freq: Two times a day (BID) | ORAL | 0 refills | Status: DC

## 2023-08-27 MED ORDER — PANTOPRAZOLE SODIUM 40 MG PO TBEC: 40.0000 mg | DELAYED_RELEASE_TABLET | Freq: Every day | ORAL | 1 refills | Status: DC

## 2023-08-27 NOTE — Telephone Encounter (Signed)
 Chief Complaint: Abdominal Pain  Symptoms: Upper abdominal pain  Frequency: constant Pertinent Negatives: Patient denies fever, nausea, vomiting  Disposition: [] ED /[] Urgent Care (no appt availability in office) / [x] Appointment(In office/virtual)/ []  [facility]/ [] Home Care/ [] Refused Recommended Disposition /[] [facility]/ []  Follow-up with PCP Additional Notes: Patient states she has had upper abdominal pain that started on Sunday and progressively has gotten worse. Patient states eating and drinking fluids makes the pain worse. Patient states she is very bloated as well. Patient reports feeling this type of pain a few years ago and was told she had an ulcer. Patient reports nothing is improving the pain at this time. Care advice was given and patient has an appointment scheduled today at [facility] at 1400.   Reason for Disposition  [1] MILD-MODERATE pain AND [2] constant AND [3] present > 2 hours  Answer Assessment - Initial Assessment Questions 1. LOCATION: Where does it hurt?      Upper abdominal pain  2. RADIATION: Does the pain shoot anywhere else? (e.g., chest, back)     No  3. ONSET: When did the pain begin? (e.g., minutes, hours or days ago)      Sunday  4. SUDDEN: Gradual or sudden onset?     Sudden 5. PATTERN Does the pain come and go, or is it constant?    - If it comes and goes: How long does it last? Do you have pain now?     (Note: Comes and goes means the pain is intermittent. It goes away completely between bouts.)    - If constant: Is it getting better, staying the same, or getting worse?      (Note: Constant means the pain never goes away completely; most serious pain is constant and gets worse.)      Constant  6. SEVERITY: How bad is the pain?  (e.g., Scale 1-10; mild, moderate, or severe)    - MILD (1-3): Doesn't interfere with normal activities, abdomen soft and not tender to touch..     - MODERATE (4-7): Interferes with  normal activities or awakens from sleep, abdomen tender to touch.     - SEVERE (8-10): Excruciating pain, doubled over, unable to do any normal activities.       5/10 7. RECURRENT SYMPTOM: Have you ever had this type of stomach pain before? If Yes, ask: When was the last time? and What happened that time?      Yes, with ulcers a few years ago  8. AGGRAVATING FACTORS: Does anything seem to cause this pain? (e.g., foods, stress, alcohol)     Eating and drinking makes it worse  9. CARDIAC SYMPTOMS: Do you have any of the following symptoms: chest pain, difficulty breathing, sweating, nausea?     No  10. OTHER SYMPTOMS: Do you have any other symptoms? (e.g., back pain, diarrhea, fever, urination pain, vomiting)       Bloated 11. PREGNANCY: Is there any chance you are pregnant? When was your last menstrual period?       No  Protocols used: Abdominal Pain - Upper-A-AH

## 2023-08-27 NOTE — Telephone Encounter (Signed)
 Patient was seen at [facility] today.

## 2023-08-27 NOTE — Progress Notes (Signed)
 Patient ID: Valerie West, female    DOB: 1986-03-15, 38 y.o.   MRN: [id number]  PCP: Valerie Kelly DASEN, FNP  Chief Complaint  Patient presents with   Abdominal Pain    Epigastric w/ bloating/full feeling worse with eating.  Does have hx of ulcers    Subjective:   Valerie West is a 38 y.o. female, presents to clinic with CC of the following:  HPI  Pt presentes with severe epigastric pain and fullness x 3+ days, worse with eating Water does not bother her No vomiting or recent GI viral illness Hx of gastritis/ulcer previously seen by [facility], endoscopy done showed gastritis, pt was on dexilant  and per chart possibly tried OTC prilosec She has recently tried tums w/o improvement No fever, no change in stool  Patient Active Problem List   Diagnosis Date Noted   Migraine with aura and without status migrainosus, not intractable 08/09/2021   GAD (generalized anxiety disorder) 08/09/2021   Annual physical exam 08/09/2021      Current Outpatient Medications:    ALPRAZolam  (XANAX ) 0.25 MG tablet, Take 1 tablet (0.25 mg total) by mouth as needed for anxiety., Disp: 90 tablet, Rfl: 0   citalopram  (CELEXA ) 10 MG tablet, Take 1 tablet (10 mg total) by mouth daily., Disp: 90 tablet, Rfl: 3   Multiple Vitamin (MULTIVITAMIN) capsule, Take by mouth., Disp: , Rfl:    rizatriptan  (MAXALT ) 10 MG tablet, Take 1 tablet (10 mg total) by mouth every 2 (two) hours as needed for migraine., Disp: 12 tablet, Rfl: 4   No Known Allergies   Social History   Tobacco Use   Smoking status: Never   Smokeless tobacco: Never  Substance Use Topics   Alcohol use: Yes    Alcohol/week: 0.0 standard drinks of alcohol    Comment: occassional   Drug use: No      Chart Review Today: I personally reviewed active problem list, medication list, allergies, family history, social history, health maintenance, notes from last encounter, lab results, imaging with the patient/caregiver today.   Review  of Systems  Constitutional: Negative.   HENT: Negative.    Eyes: Negative.   Respiratory: Negative.    Cardiovascular: Negative.   Gastrointestinal: Negative.   Endocrine: Negative.   Genitourinary: Negative.   Musculoskeletal: Negative.   Skin: Negative.   Allergic/Immunologic: Negative.   Neurological: Negative.   Hematological: Negative.   Psychiatric/Behavioral: Negative.    All other systems reviewed and are negative.      Objective:   Vitals:   08/27/23 1411  BP: 116/72  Pulse: 95  Resp: 18  Temp: 98 F (36.7 C)  Weight: 140 lb 14.4 oz (63.9 kg)  Height: 5' 3 (1.6 m)    Body mass index is 24.96 kg/m.  Physical Exam Vitals and nursing note reviewed.  Constitutional:      General: She is not in acute distress.    Appearance: She is well-developed and normal weight. She is not ill-appearing, toxic-appearing or diaphoretic.  HENT:     Head: Normocephalic and atraumatic.     Nose: Nose normal.  Eyes:     General:        Right eye: No discharge.        Left eye: No discharge.     Conjunctiva/sclera: Conjunctivae normal.  Neck:     Trachea: No tracheal deviation.  Cardiovascular:     Rate and Rhythm: Normal rate and regular rhythm.  Pulmonary:  Effort: Pulmonary effort is normal. No respiratory distress.     Breath sounds: No stridor.  Abdominal:     General: Bowel sounds are normal.     Tenderness: There is no right CVA tenderness or left CVA tenderness.  Musculoskeletal:        General: Normal range of motion.  Skin:    General: Skin is warm and dry.     Findings: No rash.  Neurological:     Mental Status: She is alert.     Motor: No abnormal muscle tone.     Coordination: Coordination normal.  Psychiatric:        Behavior: Behavior normal.      Results for orders placed or performed in visit on 08/12/23  CBC with Differential/Platelet   Collection Time: 08/12/23  9:11 AM  Result Value Ref Range   WBC 4.8 3.4 - 10.8 x10E3/uL   RBC 4.43  3.77 - 5.28 x10E6/uL   Hemoglobin 14.1 11.1 - 15.9 g/dL   Hematocrit 56.6 65.9 - 46.6 %   MCV 98 (H) 79 - 97 fL   MCH 31.8 26.6 - 33.0 pg   MCHC 32.6 31.5 - 35.7 g/dL   RDW 88.2 88.2 - 84.5 %   Platelets 214 150 - 450 x10E3/uL   Neutrophils 47 Not Estab. %   Lymphs 39 Not Estab. %   Monocytes 9 Not Estab. %   Eos 4 Not Estab. %   Basos 1 Not Estab. %   Neutrophils Absolute 2.3 1.4 - 7.0 x10E3/uL   Lymphocytes Absolute 1.9 0.7 - 3.1 x10E3/uL   Monocytes Absolute 0.4 0.1 - 0.9 x10E3/uL   EOS (ABSOLUTE) 0.2 0.0 - 0.4 x10E3/uL   Basophils Absolute 0.0 0.0 - 0.2 x10E3/uL   Immature Granulocytes 0 Not Estab. %   Immature Grans (Abs) 0.0 0.0 - 0.1 x10E3/uL  Comprehensive metabolic panel   Collection Time: 08/12/23  9:11 AM  Result Value Ref Range   Glucose 87 70 - 99 mg/dL   BUN 10 6 - 20 mg/dL   Creatinine, Ser 9.18 0.57 - 1.00 mg/dL   eGFR 95 >40 fO/fpw/8.26   BUN/Creatinine Ratio 12 9 - 23   Sodium 140 134 - 144 mmol/L   Potassium 4.4 3.5 - 5.2 mmol/L   Chloride 104 96 - 106 mmol/L   CO2 24 20 - 29 mmol/L   Calcium 9.1 8.7 - 10.2 mg/dL   Total Protein 6.4 6.0 - 8.5 g/dL   Albumin 4.6 3.9 - 4.9 g/dL   Globulin, Total 1.8 1.5 - 4.5 g/dL   Bilirubin Total 0.6 0.0 - 1.2 mg/dL   Alkaline Phosphatase 53 44 - 121 IU/L   AST 16 0 - 40 IU/L   ALT 11 0 - 32 IU/L  TSH   Collection Time: 08/12/23  9:11 AM  Result Value Ref Range   TSH 3.220 0.450 - 4.500 uIU/mL  Lipid panel   Collection Time: 08/12/23  9:11 AM  Result Value Ref Range   Cholesterol, Total 129 100 - 199 mg/dL   Triglycerides 31 0 - 149 mg/dL   HDL 75 >60 mg/dL   VLDL Cholesterol Cal 9 5 - 40 mg/dL   LDL Chol Calc (NIH) 45 0 - 99 mg/dL   Chol/HDL Ratio 1.7 0.0 - 4.4 ratio       Assessment & Plan:     ICD-10-CM   1. Acute gastritis, presence of bleeding unspecified, unspecified gastritis type  K29.00 pantoprazole  (PROTONIX ) 40 MG tablet  pantoprazole  (PROTONIX ) 40 MG tablet    Higher dose BID PPI wean down  to once daily once starting to improve Sx have only been for a few days hopefully she will note improvement in the first week of PPI use Avoid triggering foods - encouraged bland diet, avoid ASA and NSAIDs, can also use pepcid, mylanta/maalox, tums as needed No dysphagia or pain with swallowing at this time, though consider adding carafate  If no improvement would do RUQ US  for further eval   pt advised to come back here if any follow worsening in the next 2 weeks - call center to schedule    Valerie Cower, PA-C 08/27/23 2:20 PM

## 2023-09-11 ENCOUNTER — Ambulatory Visit: Payer: 59 | Admitting: Dermatology

## 2023-09-11 DIAGNOSIS — L814 Other melanin hyperpigmentation: Secondary | ICD-10-CM

## 2023-09-11 DIAGNOSIS — L578 Other skin changes due to chronic exposure to nonionizing radiation: Secondary | ICD-10-CM | POA: Diagnosis not present

## 2023-09-11 DIAGNOSIS — Z808 Family history of malignant neoplasm of other organs or systems: Secondary | ICD-10-CM

## 2023-09-11 DIAGNOSIS — D224 Melanocytic nevi of scalp and neck: Secondary | ICD-10-CM

## 2023-09-11 DIAGNOSIS — W908XXA Exposure to other nonionizing radiation, initial encounter: Secondary | ICD-10-CM

## 2023-09-11 DIAGNOSIS — L905 Scar conditions and fibrosis of skin: Secondary | ICD-10-CM

## 2023-09-11 DIAGNOSIS — D229 Melanocytic nevi, unspecified: Secondary | ICD-10-CM

## 2023-09-11 DIAGNOSIS — L821 Other seborrheic keratosis: Secondary | ICD-10-CM

## 2023-09-11 DIAGNOSIS — D2261 Melanocytic nevi of right upper limb, including shoulder: Secondary | ICD-10-CM

## 2023-09-11 DIAGNOSIS — Z1283 Encounter for screening for malignant neoplasm of skin: Secondary | ICD-10-CM

## 2023-09-11 DIAGNOSIS — D2271 Melanocytic nevi of right lower limb, including hip: Secondary | ICD-10-CM

## 2023-09-11 DIAGNOSIS — D225 Melanocytic nevi of trunk: Secondary | ICD-10-CM

## 2023-09-11 NOTE — Progress Notes (Signed)
   Follow-Up Visit   Subjective  Valerie West is a 38 y.o. female who presents for the following: Skin Cancer Screening and Full Body Skin Exam  The patient presents for Total-Body Skin Exam (TBSE) for skin cancer screening and mole check. The patient has spots, moles and lesions to be evaluated, some may be new or changing and the patient may have concern these could be cancer.    The following portions of the chart were reviewed this encounter and updated as appropriate: medications, allergies, medical history  Review of Systems:  No other skin or systemic complaints except as noted in HPI or Assessment and Plan.  Objective  Well appearing patient in no apparent distress; mood and affect are within normal limits.  A full examination was performed including scalp, head, eyes, ears, nose, lips, neck, chest, axillae, abdomen, back, buttocks, bilateral upper extremities, bilateral lower extremities, hands, feet, fingers, toes, fingernails, and toenails. All findings within normal limits unless otherwise noted below.   Relevant physical exam findings are noted in the Assessment and Plan.    Assessment & Plan   SKIN CANCER SCREENING PERFORMED TODAY.  ACTINIC DAMAGE - Chronic condition, secondary to cumulative UV/sun exposure - diffuse scaly erythematous macules with underlying dyspigmentation - Recommend daily broad spectrum sunscreen SPF 30+ to sun-exposed areas, reapply every 2 hours as needed.  - Staying in the shade or wearing long sleeves, sun glasses (UVA+UVB protection) and wide brim hats (4-inch brim around the entire circumference of the hat) are also recommended for sun protection.  - Call for new or changing lesions.  LENTIGINES, SEBORRHEIC KERATOSES - Benign normal skin lesions - Benign-appearing - Call for any changes  MELANOCYTIC NEVI - Tan-brown and/or pink-flesh-colored symmetric macules and papules 1 mm med dark brown macule right mid back 2 mm med-dark brown  macule at right breast 4 mm light brown macule at left neck 1 mm dark brown macules x 2 Right posterior upper arm 1 mm dark brown macules x 3 right lower leg  - Benign appearing on exam today - Observation - Call clinic for new or changing moles - Recommend daily use of broad spectrum spf 30+ sunscreen to sun-exposed areas.     SCAR At left mid back   Exam:linear smooth plaque.  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  Observation.  Call clinic for new or changing lesions. Recommend daily broad spectrum sunscreen SPF 30+, reapply every 2 hours as needed. Treatment: Recommend Serica moisturizing scar formula cream every night or [employer] brand or Mederma silicone scar sheet every night for the first year after a scar appears to help with scar remodeling if desired. Scars remodel on their own for a full year and will gradually improve in appearance over time.    Family history of skin cancer - what type(s): MM - who affected: Maternal Grandfather     Return in about 1 year (around 09/10/2024) for TBSE.  I, Asher Muir, CMA, am acting as scribe for Willeen Niece, MD.   Documentation: I have reviewed the above documentation for accuracy and completeness, and I agree with the above.  Willeen Niece, MD

## 2023-09-11 NOTE — Patient Instructions (Signed)

## 2023-09-13 ENCOUNTER — Ambulatory Visit: Payer: 59 | Admitting: Family Medicine

## 2023-09-13 ENCOUNTER — Other Ambulatory Visit: Payer: Self-pay | Admitting: Family Medicine

## 2023-09-13 VITALS — BP 107/70 | HR 71 | Resp 16 | Ht 63.0 in | Wt 142.0 lb

## 2023-09-13 DIAGNOSIS — K2900 Acute gastritis without bleeding: Secondary | ICD-10-CM

## 2023-09-13 MED ORDER — DEXLANSOPRAZOLE 60 MG PO CPDR: 60.0000 mg | DELAYED_RELEASE_CAPSULE | Freq: Every day | ORAL | 1 refills | Status: DC

## 2023-09-13 MED ORDER — SUCRALFATE 1 G PO TABS: 1.0000 g | ORAL_TABLET | Freq: Three times a day (TID) | ORAL | 1 refills | Status: DC

## 2023-09-13 NOTE — Telephone Encounter (Signed)
Requested medication (s) are due for refill today: yes  Requested medication (s) are on the active medication list: yes  Last refill:  09/13/23  Future visit scheduled: no  Notes to clinic:  Pharmacy comment: Alternative Requested:THE PRESCRIBED MEDICATION IS NOT COVERED BY INSURANCE. PLEASE CONSIDER CHANGING TO ONE OF THE SUGGESTED COVERED ALTERNATIVES.      Requested Prescriptions  Pending Prescriptions Disp Refills   lansoprazole (PREVACID) 30 MG capsule [Pharmacy Med Name: LANSOPRAZOLE DR 30 MG CAPSULE]  0     Gastroenterology: Proton Pump Inhibitors 2 Passed - 09/13/2023 12:14 PM      Passed - ALT in normal range and within 360 days    ALT  Date Value Ref Range Status  08/12/2023 11 0 - 32 IU/L Final   SGPT (ALT)  Date Value Ref Range Status  01/07/2014 9 (L) 12 - 78 U/L Final         Passed - AST in normal range and within 360 days    AST  Date Value Ref Range Status  08/12/2023 16 0 - 40 IU/L Final   SGOT(AST)  Date Value Ref Range Status  01/07/2014 13 (L) 15 - 37 Unit/L Final         Passed - Valid encounter within last 12 months    Recent Outpatient Visits           Today Other acute gastritis, presence of bleeding unspecified   [facility], Monico Blitz, DO   2 weeks ago Acute gastritis, presence of bleeding unspecified, unspecified gastritis type   [facility] Danelle Berry, PA-C   1 month ago Annual physical exam   [facility] Jacky Kindle, FNP   1 year ago Annual physical exam   [facility] Jacky Kindle, FNP   2 years ago Annual physical exam   [facility] Jacky Kindle, FNP       Future Appointments             In 1 year Willeen Niece, MD [facility]

## 2023-09-13 NOTE — Progress Notes (Signed)
Established patient visit   Patient: Valerie West   DOB: 06-10-1986   38 y.o. Female  MRN: [id number] Visit Date: 09/13/2023  Today's healthcare provider: Sherlyn Hay, DO   Chief Complaint  Patient presents with   stomach ulcer   Subjective    HPI The patient, Valerie West, presents with a history of stomach ulcers, which were previously diagnosed and treated in 2020. She reports a recurrence of similar pain, which initially improved with the initiation of Protonix (pantoprazole) 40mg  twice daily in early January. However, over the past week, the pain has intensified. The patient denies any associated nausea or vomiting.  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 However, she now only takes it approximately once a month. She also reports an ankle injury in November, during which she was taking ibuprofen daily, but this has not been the case since then.  The patient was previously on Dexilant (dexlansoprazole), which she reports was effective in managing her symptoms. She stopped taking this medication around the beginning of 2021. She also recalls being prescribed sucralfate in the past, but she does not remember if it was effective.  The patient describes the pain as a constant dull sensation in the upper quadrants of her stomach, centered around her epigastric region, which occasionally intensifies in waves. The pain sometimes radiates a little up into her chest area, but she does not describe it as a heartburn sensation. She has tried over-the-counter remedies such as Tums without significant relief. The patient denies any changes to her stool and any alcohol use.     Medications: Outpatient Medications Prior to Visit  Medication Sig   ALPRAZolam (XANAX) 0.25 MG tablet Take 1 tablet (0.25 mg total) by mouth as needed for anxiety.   citalopram (CELEXA) 10 MG tablet Take 1 tablet (10 mg total) by mouth  daily.   Multiple Vitamin (MULTIVITAMIN) capsule Take by mouth.   pantoprazole (PROTONIX) 40 MG tablet Take 1 tablet (40 mg total) by mouth 2 (two) times daily.   rizatriptan (MAXALT) 10 MG tablet Take 1 tablet (10 mg total) by mouth every 2 (two) hours as needed for migraine.   [DISCONTINUED] pantoprazole (PROTONIX) 40 MG tablet Take 1 tablet (40 mg total) by mouth daily.   No facility-administered medications prior to visit.    Review of Systems  Constitutional:  Negative for chills and fever.  Gastrointestinal:  Positive for abdominal pain. Negative for constipation, diarrhea, nausea and vomiting.        Objective    BP 107/70   Pulse 71   Resp 16   Ht 5\' 3"  (1.6 m)   Wt 142 lb (64.4 kg)   SpO2 100%   BMI 25.15 kg/m     Physical Exam Vitals and nursing note reviewed.  Constitutional:      General: She is not in acute distress.    Appearance: Normal appearance.  HENT:     Head: Normocephalic and atraumatic.  Eyes:     General: No scleral icterus.    Conjunctiva/sclera: Conjunctivae normal.  Cardiovascular:     Rate and Rhythm: Normal rate.  Pulmonary:     Effort: Pulmonary effort is normal.  Neurological:     Mental Status: She is alert and oriented to person, place, and time. Mental status is at baseline.  Psychiatric:        Mood and Affect: Mood normal.  Behavior: Behavior normal.      No results found for any visits on 09/13/23.  Assessment & Plan    Other acute gastritis, presence of bleeding unspecified -     Ambulatory referral to Gastroenterology -     Sucralfate; Take 1 tablet (1 g total) by mouth 4 (four) times daily -  with meals and at bedtime.  Dispense: 120 tablet; Refill: 1 -     Dexlansoprazole; Take 1 capsule (60 mg total) by mouth daily.  Dispense: 30 capsule; Refill: 1  Recurrent peptic ulcer disease with worsening upper abdominal pain over the past week. Pain is a constant dull ache with intermittent stronger waves. No associated  nausea, vomiting, or changes in stool. Partial relief with pantoprazole 40 mg twice daily. Previous effective use of dexlansoprazole. Possible exacerbation due to prior NSAID (ibuprofen) use for an ankle injury in November. Discussed benefits of dexlansoprazole, need to discontinue pantoprazole if effective, and potential insurance coverage issues. Informed about alternative use of sucralfate. - Prescribe dexlansoprazole (Dexilant) higher dose for 8 weeks - Discontinue pantoprazole if dexlansoprazole is effective - Send referral to gastroenterologist - Advise follow-up with gastroenterologist if not contacted within a few days   Return in about 11 months (around 08/11/2024) for CPE.      I discussed the assessment and treatment plan with the patient  The patient was provided an opportunity to ask questions and all were answered. The patient agreed with the plan and demonstrated an understanding of the instructions.   The patient was advised to call back or seek an in-person evaluation if the symptoms worsen or if the condition fails to improve as anticipated.    Sherlyn Hay, DO  [facility] [phone] (phone) [phone] (fax)  [facility]

## 2023-09-16 ENCOUNTER — Other Ambulatory Visit: Payer: Self-pay | Admitting: Family Medicine

## 2023-09-16 DIAGNOSIS — K2900 Acute gastritis without bleeding: Secondary | ICD-10-CM

## 2023-09-18 ENCOUNTER — Other Ambulatory Visit: Payer: Self-pay | Admitting: Family Medicine

## 2023-09-18 DIAGNOSIS — K2900 Acute gastritis without bleeding: Secondary | ICD-10-CM

## 2023-09-20 ENCOUNTER — Telehealth: Payer: Self-pay

## 2023-09-20 NOTE — Telephone Encounter (Signed)
The patient called in to see check the status of her referral and schedule an appointment. I called the patient back to let the patient know that we received her message, and schedule her with Dr. Allegra Lai on 12/03/23 at 1:30 pm. I added the patient to the waiting list.

## 2023-09-22 ENCOUNTER — Encounter: Payer: Self-pay | Admitting: Family Medicine

## 2023-10-28 ENCOUNTER — Telehealth: Admitting: Physician Assistant

## 2023-10-28 DIAGNOSIS — H6502 Acute serous otitis media, left ear: Secondary | ICD-10-CM

## 2023-10-28 MED ORDER — CIPROFLOXACIN-DEXAMETHASONE 0.3-0.1 % OT SUSP: 4.0000 [drp] | Freq: Two times a day (BID) | OTIC | 0 refills | Status: DC

## 2023-10-28 NOTE — Progress Notes (Signed)
 Virtual Visit Consent   Valerie West, you are scheduled for a virtual visit with a  provider today. Just as with appointments in the office, your consent must be obtained to participate. Your consent will be active for this visit and any virtual visit you may have with one of our providers in the next 365 days. If you have a MyChart account, a copy of this consent can be sent to you electronically.  As this is a virtual visit, video technology does not allow for your provider to perform a traditional examination. This may limit your provider's ability to fully assess your condition. If your provider identifies any concerns that need to be evaluated in person or the need to arrange testing (such as labs, EKG, etc.), we will make arrangements to do so. Although advances in technology are sophisticated, we cannot ensure that it will always work on either your end or our end. If the connection with a video visit is poor, the visit may have to be switched to a telephone visit. With either a video or telephone visit, we are not always able to ensure that we have a secure connection.  By engaging in this virtual visit, you consent to the provision of healthcare and authorize for your insurance to be billed (if applicable) for the services provided during this visit. Depending on your insurance coverage, you may receive a charge related to this service.  I need to obtain your verbal consent now. Are you willing to proceed with your visit today? Valerie West has provided verbal consent on 10/28/2023 for a virtual visit (video or telephone). Valerie Loveless, PA-C  Date: 10/28/2023 8:08 AM   Virtual Visit via Video Note   I, Valerie West, connected with  Valerie West  ([id number], [birth year]) on 10/28/23 at  8:00 AM EDT by a video-enabled telemedicine application and verified that I am speaking with the correct person using two identifiers.  Location: Patient: Virtual Visit Location  Patient: Home Provider: Virtual Visit Location Provider: Home Office   I discussed the limitations of evaluation and management by telemedicine and the availability of in person appointments. The patient expressed understanding and agreed to proceed.    History of Present Illness: Valerie West is a 38 y.o. who identifies as a female who was assigned female at birth, and is being seen today for ear pain, left.  HPI: Otalgia  There is pain in the left ear. This is a new problem. The current episode started 1 to 4 weeks ago. The problem occurs constantly. The problem has been gradually worsening. There has been no fever. The pain is moderate. Pertinent negatives include no coughing, ear discharge, headaches, hearing loss, rhinorrhea or sore throat. She has tried NSAIDs and acetaminophen for the symptoms. The treatment provided no relief. There is no history of a chronic ear infection or hearing loss.     Problems:  Patient Active Problem List   Diagnosis Date Noted   Migraine with aura and without status migrainosus, not intractable 08/09/2021   GAD (generalized anxiety disorder) 08/09/2021   Annual physical exam 08/09/2021    Allergies: No Known Allergies Medications:  Current Outpatient Medications:    ciprofloxacin-dexamethasone (CIPRODEX) OTIC suspension, Place 4 drops into the left ear 2 (two) times daily. For 7 days, Disp: 7.5 mL, Rfl: 0   ALPRAZolam (XANAX) 0.25 MG tablet, Take 1 tablet (0.25 mg total) by mouth as needed for anxiety., Disp: 90 tablet, Rfl: 0  citalopram (CELEXA) 10 MG tablet, Take 1 tablet (10 mg total) by mouth daily., Disp: 90 tablet, Rfl: 3   dexlansoprazole (DEXILANT) 60 MG capsule, Take 1 capsule (60 mg total) by mouth daily., Disp: 30 capsule, Rfl: 1   Multiple Vitamin (MULTIVITAMIN) capsule, Take by mouth., Disp: , Rfl:    pantoprazole (PROTONIX) 40 MG tablet, Take 1 tablet (40 mg total) by mouth 2 (two) times daily., Disp: 60 tablet, Rfl: 0   rizatriptan  (MAXALT) 10 MG tablet, Take 1 tablet (10 mg total) by mouth every 2 (two) hours as needed for migraine., Disp: 12 tablet, Rfl: 4   sucralfate (CARAFATE) 1 g tablet, Take 1 tablet (1 g total) by mouth 4 (four) times daily -  with meals and at bedtime., Disp: 120 tablet, Rfl: 1  Observations/Objective: Patient is well-developed, well-nourished in no acute distress.  Resting comfortably at home.  Head is normocephalic, atraumatic.  No labored breathing.  Speech is clear and coherent with logical content.  Patient is alert and oriented at baseline.    Assessment and Plan: 1. Non-recurrent acute serous otitis media of left ear (Primary) - ciprofloxacin-dexamethasone (CIPRODEX) OTIC suspension; Place 4 drops into the left ear 2 (two) times daily. For 7 days  Dispense: 7.5 mL; Refill: 0  - Worsening symptoms that have not responded to OTC medications.  - Will give Ciprodex drops - Continue saline nasal rinses - Could consider to add Flonase (Fluticasone) nasal spray over the counter for possible eustachian tube dysfunction - Steam and humidifier can help - Warm compress to ear - Stay well hydrated and get plenty of rest.  - Seek in person evaluation if no symptom improvement or if symptoms worsen   Follow Up Instructions: I discussed the assessment and treatment plan with the patient. The patient was provided an opportunity to ask questions and all were answered. The patient agreed with the plan and demonstrated an understanding of the instructions.  A copy of instructions were sent to the patient via MyChart unless otherwise noted below.    The patient was advised to call back or seek an in-person evaluation if the symptoms worsen or if the condition fails to improve as anticipated.    Valerie Loveless, PA-C

## 2023-10-28 NOTE — Patient Instructions (Signed)
 Valerie West, thank you for joining Valerie Loveless, PA-C for today's virtual visit.  While this provider is not your primary care provider (PCP), if your PCP is located in our provider database this encounter information will be shared with them immediately following your visit.   A [facility] MyChart account gives you access to today's visit and all your visits, tests, and labs performed at [facility] " click here if you don't have a [facility] MyChart account or go to [email or web address]  Consent: (Patient) Valerie West provided verbal consent for this virtual visit at the beginning of the encounter.  Current Medications:  Current Outpatient Medications:    ciprofloxacin-dexamethasone (CIPRODEX) OTIC suspension, Place 4 drops into the left ear 2 (two) times daily. For 7 days, Disp: 7.5 mL, Rfl: 0   ALPRAZolam (XANAX) 0.25 MG tablet, Take 1 tablet (0.25 mg total) by mouth as needed for anxiety., Disp: 90 tablet, Rfl: 0   citalopram (CELEXA) 10 MG tablet, Take 1 tablet (10 mg total) by mouth daily., Disp: 90 tablet, Rfl: 3   dexlansoprazole (DEXILANT) 60 MG capsule, Take 1 capsule (60 mg total) by mouth daily., Disp: 30 capsule, Rfl: 1   Multiple Vitamin (MULTIVITAMIN) capsule, Take by mouth., Disp: , Rfl:    pantoprazole (PROTONIX) 40 MG tablet, Take 1 tablet (40 mg total) by mouth 2 (two) times daily., Disp: 60 tablet, Rfl: 0   rizatriptan (MAXALT) 10 MG tablet, Take 1 tablet (10 mg total) by mouth every 2 (two) hours as needed for migraine., Disp: 12 tablet, Rfl: 4   sucralfate (CARAFATE) 1 g tablet, Take 1 tablet (1 g total) by mouth 4 (four) times daily -  with meals and at bedtime., Disp: 120 tablet, Rfl: 1   Medications ordered in this encounter:  Meds ordered this encounter  Medications   ciprofloxacin-dexamethasone (CIPRODEX) OTIC suspension    Sig: Place 4 drops into the left ear 2 (two) times daily. For 7 days    Dispense:  7.5 mL    Refill:  0     Supervising Provider:   Merrilee Jansky [[id number]]     *If you need refills on other medications prior to your next appointment, please contact your pharmacy*  Follow-Up: Call back or seek an in-person evaluation if the symptoms worsen or if the condition fails to improve as anticipated.  [facility] [phone]  Other Instructions Earache, Adult An earache, or ear pain, can be caused by many things, including: An infection. Ear wax buildup. Ear pressure. Something in the ear that should not be there (foreign body). A sore throat. Tooth problems. Jaw problems. Treatment of the earache will depend on the cause. If the cause is not clear or cannot be known, you may need to watch your symptoms until your earache goes away or until a cause is found. Follow these instructions at home: Medicines Take or apply over-the-counter and prescription medicines only as told by your health care provider. If you were prescribed antibiotics, use them as told by your health care provider. Do not stop using the antibiotic even if you start to feel better. Do not put anything in your ear other than medicine that is prescribed by your health care provider. Managing pain     If directed, apply heat to the affected area as often as told by your health care provider. Use the heat source that your health care provider recommends, such as a moist heat pack or a heating  pad. Place a towel between your skin and the heat source. Leave the heat on for 20-30 minutes. If your skin turns bright red, remove the heat right away to prevent burns. The risk of burns is higher if you cannot feel pain, heat, or cold. If directed, put ice on the affected area. To do this: Put ice in a plastic bag. Place a towel between your skin and the bag. Leave the ice on for 20 minutes, 2-3 times a day. If your skin turns bright red, remove the ice right away to prevent skin damage. The risk of skin damage is higher  if you cannot feel pain, heat, or cold.  General instructions Pay attention to any changes in your symptoms. Try resting in an upright position instead of lying down. This may help to reduce pressure in your ear and relieve pain. Chew gum if it helps to relieve your ear pain. Treat any allergies as told by your health care provider. Drink enough fluid to keep your urine pale yellow. It is up to you to get the results of any tests that were done. Ask your health care provider, or the department that is doing the tests, when your results will be ready. Contact a health care provider if: Your pain does not improve within 2 days. Your earache gets worse. You have new symptoms. You have a fever. Get help right away if: You have a severe headache. You have a stiff neck. You have trouble swallowing. You have redness or swelling behind your ear. You have fluid or blood coming from your ear. You have hearing loss. You feel dizzy. This information is not intended to replace advice given to you by your health care provider. Make sure you discuss any questions you have with your health care provider. Document Revised: 12/18/2021 Document Reviewed: 12/18/2021 Elsevier Patient Education  2024 Elsevier Inc.   If you have been instructed to have an in-person evaluation today at a local Urgent Care facility, please use the link below. It will take you to a list of all of our available [facility], including address, phone number and hours of operation. Please do not delay care.  [facility]  If you or a family member do not have a primary care provider, use the link below to schedule a visit and establish care. When you choose a [facility] primary care physician or advanced practice provider, you gain a long-term partner in health. Find a Primary Care Provider  Learn more about [facility]'s in-office and virtual care options: [facility] - Get Care Now

## 2023-11-23 ENCOUNTER — Other Ambulatory Visit: Payer: Self-pay | Admitting: Family Medicine

## 2023-11-23 DIAGNOSIS — K2900 Acute gastritis without bleeding: Secondary | ICD-10-CM

## 2023-11-25 NOTE — Telephone Encounter (Signed)
 Requested medication (s) are due for refill today: yes  Requested medication (s) are on the active medication list: yes  Last refill:  08/27/23 #60/0  Future visit scheduled: no  Notes to clinic:  is pt suppose to continue BID or drop down to Q D per LOV notes?     Requested Prescriptions  Pending Prescriptions Disp Refills   pantoprazole (PROTONIX) 40 MG tablet [Pharmacy Med Name: PANTOPRAZOLE SOD DR 40 MG TAB] 30 tablet 1    Sig: TAKE 1 TABLET BY MOUTH EVERY DAY     Gastroenterology: Proton Pump Inhibitors Failed - 11/25/2023  2:50 PM      Failed - Valid encounter within last 12 months    Recent Outpatient Visits   None     Future Appointments             In 9 months Willeen Niece, MD [facility]

## 2023-11-28 ENCOUNTER — Other Ambulatory Visit: Payer: Self-pay

## 2023-12-03 ENCOUNTER — Ambulatory Visit: Payer: 59 | Admitting: Gastroenterology

## 2023-12-03 ENCOUNTER — Encounter: Payer: Self-pay | Admitting: Gastroenterology

## 2023-12-03 VITALS — BP 113/71 | HR 76 | Temp 98.0°F | Ht 63.0 in | Wt 137.4 lb

## 2023-12-03 DIAGNOSIS — R1013 Epigastric pain: Secondary | ICD-10-CM

## 2023-12-03 DIAGNOSIS — R140 Abdominal distension (gaseous): Secondary | ICD-10-CM

## 2023-12-03 NOTE — Progress Notes (Unsigned)
 Valerie Repress, MD [address]  [address]  [place], [place] [address]  Main: [phone]  Fax: [phone]    Gastroenterology Consultation  Referring Provider:     Sherlyn Hay, DO Primary Care Physician:  Valerie Hay, DO Primary Gastroenterologist:  Dr. Arlyss West Reason for Consultation: Epigastric pain        HPI:   Valerie West is a 38 y.o. female referred by Dr. Sherlyn Hay, DO  for consultation & management of epigastric pain.  Patient stated that until 2 weeks ago she was experiencing epigastric pain.  In November she started drinking protein supplements 3 times a week in the morning.  Her symptoms of epigastric discomfort associated with bloating and feeling full which were worse after eating started in January.  She was seen by her PCP tried Protonix which did not seem to help.  She was referred to GI for further evaluation.  End of January and she was started on sucralfate and switched it to Dexilant.  Patient seemed to have benefited from sucralfate and also she stopped drinking protein supplements 2 weeks ago.  Her epigastric pain, bloating and fullness have completely resolved.  She wanted to keep this appointment in order to establish care with me just in case if her symptoms recur in future.  She underwent workup in the past by Dr. Tobi Bastos for epigastric pain, it was unremarkable.  She is otherwise healthy, does not smoke or drink alcohol.  Her symptoms in 2020 were attributed to Excedrin use in setting of migraine headaches when she was working from home constantly in front of the computer during pandemic.  She is a Runner, broadcasting/film/video  NSAIDs: None  Antiplts/Anticoagulants/Anti thrombotics: None  GI Procedures:  Upper endoscopy 07/02/2019 - Normal esophagus. - Normal examined duodenum. - Gastritis. Biopsied. DIAGNOSIS:  A. STOMACH; COLD BIOPSY:  - GASTRIC ANTRAL AND OXYNTIC MUCOSA WITH FEATURES OF REACTIVE  GASTROPATHY AND FOCAL ACTIVE INFLAMMATION.  -  NEGATIVE FOR H. PYLORI, DYSPLASIA, AND MALIGNANCY.   Past Medical History:  Diagnosis Date   Anxiety    Complication of anesthesia    Depression during pregnancy, antepartum unk   PONV (postoperative nausea and vomiting)     Past Surgical History:  Procedure Laterality Date   ESOPHAGOGASTRODUODENOSCOPY (EGD) WITH PROPOFOL N/A 07/02/2019   Procedure: ESOPHAGOGASTRODUODENOSCOPY (EGD) WITH PROPOFOL;  Surgeon: Wyline Mood, MD;  Location: [facility] ENDOSCOPY;  Service: Gastroenterology;  Laterality: N/A;   LAPAROSCOPIC APPENDECTOMY N/A 06/23/2016   Procedure: APPENDECTOMY LAPAROSCOPIC;  Surgeon: Ricarda Frame, MD;  Location: [facility] ORS;  Service: General;  Laterality: N/A;   LOBECTOMY Left 12/07/2016     Current Outpatient Medications:    ALPRAZolam (XANAX) 0.25 MG tablet, Take 1 tablet (0.25 mg total) by mouth as needed for anxiety., Disp: 90 tablet, Rfl: 0   citalopram (CELEXA) 10 MG tablet, Take 1 tablet (10 mg total) by mouth daily., Disp: 90 tablet, Rfl: 3   Multiple Vitamin (MULTIVITAMIN) capsule, Take by mouth., Disp: , Rfl:    rizatriptan (MAXALT) 10 MG tablet, Take 1 tablet (10 mg total) by mouth every 2 (two) hours as needed for migraine., Disp: 12 tablet, Rfl: 4   pantoprazole (PROTONIX) 40 MG tablet, Take 1 tablet (40 mg total) by mouth 2 (two) times daily. (Patient not taking: Reported on 12/03/2023), Disp: 60 tablet, Rfl: 0   Family History  Problem Relation Age of Onset   Depression Father    Bipolar disorder Father    Alcohol  abuse Father    Drug abuse Father    Cancer Maternal Grandmother    Diabetes Maternal Grandmother    Diabetes Maternal Grandfather    Cancer Maternal Grandfather    Hypertension Mother      Social History   Tobacco Use   Smoking status: Never   Smokeless tobacco: Never  Substance Use Topics   Alcohol use: Yes    Alcohol/week: 0.0 standard drinks of alcohol    Comment: occassional   Drug use: No    Allergies as of 12/03/2023   (No Known  Allergies)    Review of Systems:    All systems reviewed and negative except where noted in HPI.   Physical Exam:  BP 113/71 (BP Location: Left Arm, Patient Position: Sitting, Cuff Size: Normal)   Pulse 76   Temp 98 F (36.7 C) (Oral)   Ht 5\' 3"  (1.6 m)   Wt 137 lb 6 oz (62.3 kg)   BMI 24.33 kg/m  No LMP recorded.  General:   Alert,  Well-developed, well-nourished, pleasant and cooperative in NAD Head:  Normocephalic and atraumatic. Eyes:  Sclera clear, no icterus.   Conjunctiva pink. Ears:  Normal auditory acuity. Nose:  No deformity, discharge, or lesions. Mouth:  No deformity or lesions,oropharynx pink & moist. Neck:  Supple; no masses or thyromegaly. Lungs:  Respirations even and unlabored.  Clear throughout to auscultation.   No wheezes, crackles, or rhonchi. No acute distress. Heart:  Regular rate and rhythm; no murmurs, clicks, rubs, or gallops. Abdomen:  Normal bowel sounds. Soft, non-tender and non-distended without masses, hepatosplenomegaly or hernias noted.  No guarding or rebound tenderness.   Rectal: Not performed Msk:  Symmetrical without gross deformities. Good, equal movement & strength bilaterally. Pulses:  Normal pulses noted. Extremities:  No clubbing or edema.  No cyanosis. Neurologic:  Alert and oriented x3;  grossly normal neurologically. Skin:  Intact without significant lesions or rashes. No jaundice. Psych:  Alert and cooperative. Normal mood and affect.  Imaging Studies: Reviewed  Assessment and Plan:   Valerie West is a 38 y.o. pleasant female with no significant past medical history is seen in consultation for approximately 4 months history of epigastric discomfort, bloating and fullness worse postprandial relieved after stopping protein supplement smoothly on a regular basis Do not recommend any further workup at this time unless her symptoms recur then recommend to rule out H. pylori infection as well as evaluate for biliary  dyskinesia  Follow up as needed   Karma Oz, MD

## 2023-12-04 ENCOUNTER — Encounter: Payer: Self-pay | Admitting: Gastroenterology

## 2024-02-11 ENCOUNTER — Ambulatory Visit
Admission: EM | Admit: 2024-02-11 | Discharge: 2024-02-11 | Disposition: A | Discharge status: Home / Self Care | Attending: Emergency Medicine | Admitting: Emergency Medicine

## 2024-02-11 DIAGNOSIS — S00462A Insect bite (nonvenomous) of left ear, initial encounter: Secondary | ICD-10-CM | POA: Diagnosis not present

## 2024-02-11 DIAGNOSIS — H6012 Cellulitis of left external ear: Secondary | ICD-10-CM

## 2024-02-11 DIAGNOSIS — W57XXXA Bitten or stung by nonvenomous insect and other nonvenomous arthropods, initial encounter: Secondary | ICD-10-CM | POA: Diagnosis not present

## 2024-02-11 MED ORDER — CEPHALEXIN 500 MG PO CAPS: 500.0000 mg | ORAL_CAPSULE | Freq: Four times a day (QID) | ORAL | 0 refills | Status: DC

## 2024-02-11 MED ORDER — MUPIROCIN 2 % EX OINT: 1.0000 | TOPICAL_OINTMENT | Freq: Two times a day (BID) | CUTANEOUS | 0 refills | Status: DC

## 2024-02-11 NOTE — ED Triage Notes (Signed)
 Patient to Urgent Care with complaints of left sided ear pain. Concerned about a bug bite.   Woke up with a bite on her ear Saturday morning. Reports she woke up with pain radiating into her neck yesterday.

## 2024-02-11 NOTE — ED Provider Notes (Signed)
 [facility]    CSN: [id number] Arrival date & time: 02/11/24  1257      History   Chief Complaint Chief Complaint  Patient presents with   Otalgia    HPI CLEOTHA WHALIN is a 38 y.o. female.  Patient presents with left ear pain x 4 days.  She was at the beach over the weekend and woke up Saturday morning to several bumps just in front of her left external ear.  She believes these to be insect bites.  She attempted treatment with hydrocortisone cream.  The pain from the insect bites has gotten worse and is now in her left ear canal and left side of her neck.  She reports no fever, chills, sore throat, difficulty swallowing, cough, shortness of breath.  The history is provided by the patient and medical records.    Past Medical History:  Diagnosis Date   Anxiety    Complication of anesthesia    Depression during pregnancy, antepartum unk   PONV (postoperative nausea and vomiting)     Patient Active Problem List   Diagnosis Date Noted   Plantar fasciitis of left foot 06/11/2023   Tibialis posterior tendinitis 06/11/2023   Migraine with aura and without status migrainosus, not intractable 08/09/2021   GAD (generalized anxiety disorder) 08/09/2021   Annual physical exam 08/09/2021    Past Surgical History:  Procedure Laterality Date   ESOPHAGOGASTRODUODENOSCOPY (EGD) WITH PROPOFOL  N/A 07/02/2019   Procedure: ESOPHAGOGASTRODUODENOSCOPY (EGD) WITH PROPOFOL ;  Surgeon: Therisa Bi, MD;  Location: [facility] ENDOSCOPY;  Service: Gastroenterology;  Laterality: N/A;   LAPAROSCOPIC APPENDECTOMY N/A 06/23/2016   Procedure: APPENDECTOMY LAPAROSCOPIC;  Surgeon: Carlin Pastel, MD;  Location: [facility] ORS;  Service: General;  Laterality: N/A;   LOBECTOMY Left 12/07/2016    OB History     Gravida  2   Para  2   Term      Preterm      AB      Living         SAB      IAB      Ectopic      Multiple      Live Births               Home Medications    Prior to  Admission medications   Medication Sig Start Date End Date Taking? Authorizing Provider  cephALEXin (KEFLEX) 500 MG capsule Take 1 capsule (500 mg total) by mouth 4 (four) times daily. 02/11/24  Yes Corlis Burnard DEL, NP  mupirocin ointment (BACTROBAN) 2 % Apply 1 Application topically 2 (two) times daily. 02/11/24  Yes Corlis Burnard DEL, NP  ALPRAZolam  (XANAX ) 0.25 MG tablet Take 1 tablet (0.25 mg total) by mouth as needed for anxiety. 08/12/23   Emilio Plummer Matich DASEN, FNP  citalopram  (CELEXA ) 10 MG tablet Take 1 tablet (10 mg total) by mouth daily. 08/12/23   Emilio Niesha Bame DASEN, FNP  Multiple Vitamin (MULTIVITAMIN) capsule Take by mouth.    [provider]  pantoprazole  (PROTONIX ) 40 MG tablet Take 1 tablet (40 mg total) by mouth 2 (two) times daily. Patient not taking: Reported on 12/03/2023 08/27/23   Tapia, Leisa, PA-C  rizatriptan  (MAXALT ) 10 MG tablet Take 1 tablet (10 mg total) by mouth every 2 (two) hours as needed for migraine. 08/12/23   Emilio Marisa Hufstetler DASEN, FNP    Family History Family History  Problem Relation Age of Onset   Depression Father    Bipolar disorder Father  Alcohol abuse Father    Drug abuse Father    Cancer Maternal Grandmother    Diabetes Maternal Grandmother    Diabetes Maternal Grandfather    Cancer Maternal Grandfather    Hypertension Mother     Social History Social History   Tobacco Use   Smoking status: Never   Smokeless tobacco: Never  Substance Use Topics   Alcohol use: Yes    Alcohol/week: 0.0 standard drinks of alcohol    Comment: occassional   Drug use: No     Allergies   Patient has no known allergies.   Review of Systems Review of Systems  Constitutional:  Negative for chills and fever.  HENT:  Positive for ear pain. Negative for ear discharge, sore throat, trouble swallowing and voice change.   Respiratory:  Negative for cough and shortness of breath.   Skin:  Positive for wound. Negative for color change.     Physical Exam Triage Vital  Signs ED Triage Vitals  Encounter Vitals Group     BP 02/11/24 1329 106/68     Girls Systolic BP Percentile --      Girls Diastolic BP Percentile --      Boys Systolic BP Percentile --      Boys Diastolic BP Percentile --      Pulse Rate 02/11/24 1329 79     Resp 02/11/24 1329 18     Temp 02/11/24 1329 98.1 F (36.7 C)     Temp src --      SpO2 02/11/24 1329 97 %     Weight --      Height --      Head Circumference --      Peak Flow --      Pain Score 02/11/24 1331 5     Pain Loc --      Pain Education --      Exclude from Growth Chart --    No data found.  Updated Vital Signs BP 106/68   Pulse 79   Temp 98.1 F (36.7 C)   Resp 18   LMP 02/04/2024   SpO2 97%   Visual Acuity Right Eye Distance:   Left Eye Distance:   Bilateral Distance:    Right Eye Near:   Left Eye Near:    Bilateral Near:     Physical Exam Constitutional:      General: She is not in acute distress. HENT:     Right Ear: Tympanic membrane and ear canal normal.     Left Ear: Tympanic membrane and ear canal normal.     Mouth/Throat:     Mouth: Mucous membranes are moist.     Pharynx: Oropharynx is clear.   Cardiovascular:     Rate and Rhythm: Normal rate and regular rhythm.     Heart sounds: Normal heart sounds.  Pulmonary:     Effort: Pulmonary effort is normal. No respiratory distress.     Breath sounds: Normal breath sounds.   Musculoskeletal:     Cervical back: Neck supple.  Lymphadenopathy:     Cervical: No cervical adenopathy.   Skin:    General: Skin is warm and dry.     Findings: Lesion present.     Comments: Several small crusted lesions on anterior left ear area with localized swelling and erythema. No drainage.    Neurological:     Mental Status: She is alert.      UC Treatments / Results  Labs (all labs ordered are listed,  but only abnormal results are displayed) Labs Reviewed - No data to display  EKG   Radiology No results found.  Procedures Procedures  (including critical care time)  Medications Ordered in UC Medications - No data to display  Initial Impression / Assessment and Plan / UC Course  I have reviewed the triage vital signs and the nursing notes.  Pertinent labs & imaging results that were available during my care of the patient were reviewed by me and considered in my medical decision making (see chart for details).    Cellulitis of left external ear due to insect bites.  Treating today with cephalexin and mupirocin ointment.  Instructed patient to follow-up with her PCP.  ED precautions given.  Education provided on insect bites and cellulitis.  She agrees to plan of care.  Final Clinical Impressions(s) / UC Diagnoses   Final diagnoses:  Cellulitis of left external ear  Insect bite of left ear, initial encounter     Discharge Instructions      Follow up with your primary care provider tomorrow.  Go to the emergency department if you have worsening symptoms.    Take the cephalexin as directed.  Use the mupirocin ointment as directed.      ED Prescriptions     Medication Sig Dispense Auth. Provider   mupirocin ointment (BACTROBAN) 2 % Apply 1 Application topically 2 (two) times daily. 22 g Corlis Burnard DEL, NP   cephALEXin (KEFLEX) 500 MG capsule Take 1 capsule (500 mg total) by mouth 4 (four) times daily. 28 capsule Corlis Burnard DEL, NP      PDMP not reviewed this encounter.   Corlis Burnard DEL, NP 02/11/24 1359

## 2024-02-11 NOTE — Discharge Instructions (Addendum)
 Follow up with your primary care provider tomorrow.  Go to the emergency department if you have worsening symptoms.    Take the cephalexin as directed.  Use the mupirocin ointment as directed.

## 2024-02-18 ENCOUNTER — Ambulatory Visit: Admitting: Dermatology

## 2024-02-18 DIAGNOSIS — R21 Rash and other nonspecific skin eruption: Secondary | ICD-10-CM | POA: Diagnosis not present

## 2024-02-18 DIAGNOSIS — R208 Other disturbances of skin sensation: Secondary | ICD-10-CM

## 2024-02-18 MED ORDER — VALACYCLOVIR HCL 1 G PO TABS: 1000.0000 mg | ORAL_TABLET | Freq: Three times a day (TID) | ORAL | 0 refills | Status: AC

## 2024-02-18 NOTE — Patient Instructions (Signed)

## 2024-02-18 NOTE — Progress Notes (Signed)
   Follow-Up Visit   Subjective  Valerie West is a 38 y.o. female who presents for the following: History of possible bite at the left preauricular ear on the night of June 20 while at the beach sleeping. She woke up the next morning with pain and swelling with a small blister, worsening pain down neck as the day went on. Last Tues was prescribed Keflex  and mupirocin  ointment. She has one antibiotic pill left. Pain in the neck has improved slightly, but still there and feels it in neck. Swelling has gone down. She is taking ibuprofen for pain.   The following portions of the chart were reviewed this encounter and updated as appropriate: medications, allergies, medical history  Review of Systems:  No other skin or systemic complaints except as noted in HPI or Assessment and Plan.  Objective  Well appearing patient in no apparent distress; mood and affect are within normal limits.  A focused examination was performed of the following areas: Left ear  Relevant physical exam findings are noted in the Assessment and Plan.   Left preauricular  Assessment & Plan  BULLOUS IMPETIGO VS INFECTED BITE REACTION VS VZV/HSV Left preauricular  Patient has had chicken pox. No history of fever blisters.  RASH AND OTHER NONSPECIFIC SKIN ERUPTION   Related Procedures Anaerobic and Aerobic Culture HSV and VZV PCR Panel Exam: Violaceous clustered dried vesicles at the left preauricular.  VZV/HSV PCR and Anaerobic Bacterial culture performed today.  Continue mupirocin  2% ointment apply to aa twice daily until improved. Patient has at home. Start Valtrex  1000 mg take 1 po tid x 7 days dsp #21 0Rf  Return if symptoms worsen or fail to improve or TBSE as scheduled.  IAndrea Kerns, CMA, am acting as scribe for Rexene Rattler, MD .   Documentation: I have reviewed the above documentation for accuracy and completeness, and I agree with the above.  Rexene Rattler, MD

## 2024-02-22 LAB — HSV AND VZV PCR PANEL
HSV 2 DNA: NEGATIVE
HSV-1 DNA: POSITIVE — AB
Varicella-Zoster, PCR: NEGATIVE

## 2024-02-24 ENCOUNTER — Other Ambulatory Visit: Payer: Self-pay

## 2024-02-24 ENCOUNTER — Ambulatory Visit: Payer: Self-pay | Admitting: Dermatology

## 2024-02-24 MED ORDER — VALACYCLOVIR HCL 1 G PO TABS: 2000.0000 mg | ORAL_TABLET | Freq: Two times a day (BID) | ORAL | 3 refills | Status: DC

## 2024-02-24 NOTE — Telephone Encounter (Signed)
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  Please send in Rx for Valtrex  2 gm bid x 1 upon onset of tingling/burning symptoms, #30 - please call patient  Herpes Simplex Virus = Cold Sores = Fever Blisters is a chronic recurring blistering; scabbing sore-producing viral infection that is recurrent usually in the same area triggered by stress, sun/UV  exposure and trauma.  It is infectious and can be spread from person to person by direct contact.  It is not curable, but is treatable with topical and oral medication.  ----- Message ----- From: Interface, [facility] Lab Results In Sent: 02/22/2024   7:40 AM EDT To: Rexene Rattler, MD

## 2024-02-24 NOTE — Telephone Encounter (Signed)
-----   Message from Rexene Rattler sent at 02/24/2024  4:32 PM EDT ----- The tingling from the HSV would be felt on one side of her face where the sore came up, not both sides.  So I'm not sure that is related.  If she got a sunburn on her face, the skin could tingle some  with healing.  When she feels a tingling, burning sensation (prodrome) in the area where she had the outbreak, she should start the Valtrex  as prescribed 2 grams twice daily x 1 day as soon as  possible, and that should hopefully prevent it from erupting. ----- Message ----- From: Teresa Alan SAUNDERS, CMA Sent: 02/24/2024   2:35 PM EDT To: Rexene Rattler, MD  ----- Message from Alan SAUNDERS Teresa, CMA sent at 02/24/2024  2:35 PM EDT -----

## 2024-02-24 NOTE — Telephone Encounter (Signed)
 Patient has been advised of all information. aw

## 2024-02-24 NOTE — Telephone Encounter (Signed)
 Patient advised of culture results. She states the pain is getting better and the area itself has improved, she will finish her valtrex . She did have two concerns: 1) She has developed a tingle sensation on both sides of her face, below the eyes. Can this be related?  2) What should she do if there is ever any reoccurrence?

## 2024-02-24 NOTE — Telephone Encounter (Signed)
 Left pt message to call for results/sh

## 2024-02-26 LAB — ANAEROBIC AND AEROBIC CULTURE

## 2024-02-28 ENCOUNTER — Encounter: Payer: Self-pay | Admitting: Dermatology

## 2024-03-02 NOTE — Telephone Encounter (Signed)
-----   Message from Rexene Rattler sent at 03/02/2024  9:16 AM EDT ----- Bacterial culture shows colonization of bacteria normally present on skin.  Also scant fungus (likely yeast).  As long as skin has healed and clear, no further treatment is needed. - please call  patient ----- Message ----- From: Rebecka Memos Lab Results In Sent: 02/22/2024   7:40 AM EDT To: Rexene Rattler, MD

## 2024-03-02 NOTE — Telephone Encounter (Signed)
 Left message on voicemail . MyChart message sent with results.

## 2024-05-08 ENCOUNTER — Telehealth: Admitting: Physician Assistant

## 2024-05-08 DIAGNOSIS — J019 Acute sinusitis, unspecified: Secondary | ICD-10-CM

## 2024-05-08 DIAGNOSIS — B9689 Other specified bacterial agents as the cause of diseases classified elsewhere: Secondary | ICD-10-CM | POA: Diagnosis not present

## 2024-05-08 MED ORDER — FLUTICASONE PROPIONATE 50 MCG/ACT NA SUSP: 2.0000 | Freq: Every day | NASAL | 6 refills | Status: DC

## 2024-05-08 MED ORDER — AYR SALINE NASAL DROPS 0.65 % NA SOLN: 1.0000 | Freq: Every day | NASAL | 0 refills | Status: DC

## 2024-05-08 MED ORDER — AZITHROMYCIN 250 MG PO TABS: ORAL_TABLET | ORAL | 0 refills | Status: AC

## 2024-05-08 NOTE — Progress Notes (Signed)
 Virtual Visit Consent   Vena LITTIE Quest, you are scheduled for a virtual visit with a [facility] provider today. Just as with appointments in the office, your consent must be obtained to participate. Your consent will be active for this visit and any virtual visit you may have with one of our providers in the next 365 days. If you have a MyChart account, a copy of this consent can be sent to you electronically.  As this is a virtual visit, video technology does not allow for your provider to perform a traditional examination. This may limit your provider's ability to fully assess your condition. If your provider identifies any concerns that need to be evaluated in person or the need to arrange testing (such as labs, EKG, etc.), we will make arrangements to do so. Although advances in technology are sophisticated, we cannot ensure that it will always work on either your end or our end. If the connection with a video visit is poor, the visit may have to be switched to a telephone visit. With either a video or telephone visit, we are not always able to ensure that we have a secure connection.  By engaging in this virtual visit, you consent to the provision of healthcare and authorize for your insurance to be billed (if applicable) for the services provided during this visit. Depending on your insurance coverage, you may receive a charge related to this service.  I need to obtain your verbal consent now. Are you willing to proceed with your visit today? [name] has provided verbal consent on 05/08/2024 for a virtual visit (video or telephone). Teena Shuck, [place]  Date: 05/08/2024 2:45 PM   Virtual Visit via Video Note   I, Teena Shuck, connected with  [name]  ([id number], 1986-02-18) on 05/08/24 at  2:45 PM EDT by a video-enabled telemedicine application and verified that I am speaking with the correct person using two identifiers.  Location: Patient: Virtual Visit Location Patient:  Home Provider: Virtual Visit Location Provider: Home Office   I discussed the limitations of evaluation and management by telemedicine and the availability of in person appointments. The patient expressed understanding and agreed to proceed.    History of Present Illness: Valerie West is a 38 y.o. who identifies as a female who was assigned female at birth, and is being seen today for sinus pain.  HPI: Sinusitis This is a new problem. The current episode started 1 to 4 weeks ago. The problem is unchanged. There has been no fever. The pain is mild. Associated symptoms include congestion and sinus pressure. Pertinent negatives include no chills, coughing, diaphoresis, ear pain, headaches, hoarse voice, neck pain, shortness of breath, sneezing, sore throat or swollen glands. Past treatments include oral decongestants. The treatment provided mild relief.    Problems:  Patient Active Problem List   Diagnosis Date Noted   Plantar fasciitis of left foot 06/11/2023   Tibialis posterior tendinitis 06/11/2023   Migraine with aura and without status migrainosus, not intractable 08/09/2021   GAD (generalized anxiety disorder) 08/09/2021   Annual physical exam 08/09/2021    Allergies: No Known Allergies Medications:  Current Outpatient Medications:    ALPRAZolam  (XANAX ) 0.25 MG tablet, Take 1 tablet (0.25 mg total) by mouth as needed for anxiety., Disp: 90 tablet, Rfl: 0   cephALEXin  (KEFLEX ) 500 MG capsule, Take 1 capsule (500 mg total) by mouth 4 (four) times daily., Disp: 28 capsule, Rfl: 0   citalopram  (CELEXA ) 10 MG tablet,  Take 1 tablet (10 mg total) by mouth daily., Disp: 90 tablet, Rfl: 3   Multiple Vitamin (MULTIVITAMIN) capsule, Take by mouth., Disp: , Rfl:    mupirocin  ointment (BACTROBAN ) 2 %, Apply 1 Application topically 2 (two) times daily., Disp: 22 g, Rfl: 0   pantoprazole  (PROTONIX ) 40 MG tablet, Take 1 tablet (40 mg total) by mouth 2 (two) times daily. (Patient not taking:  Reported on 12/03/2023), Disp: 60 tablet, Rfl: 0   rizatriptan  (MAXALT ) 10 MG tablet, Take 1 tablet (10 mg total) by mouth every 2 (two) hours as needed for migraine., Disp: 12 tablet, Rfl: 4   valACYclovir  (VALTREX ) 1000 MG tablet, Take 2 tablets (2,000 mg total) by mouth 2 (two) times daily. 2 po at onset of fever blister symptoms, then 2 po 12 hour later, Disp: 30 tablet, Rfl: 3  Observations/Objective: Patient is well-developed, well-nourished in no acute distress.  Resting comfortably  at home.  Head is normocephalic, atraumatic.  No labored breathing.  Speech is clear and coherent with logical content.  Patient is alert and oriented at baseline.    Assessment and Plan: 1. Acute bacterial sinusitis (Primary)  Presentation consistent with sinusitis.  No evidence of other bacterial infections including pneumonia, meningitis, pharyngitis, otitis media.  Discussed that this fits picture of viral vs bacterial sinusitis and that due to type and duration of symptoms and exam findings, we will treat as bacterial sinusitis.  Antibiotics prescribed. Advised to continue ibuprofen and Tylenol  at home. Patient is to follow up with primary physician if having continued symptoms.   Advised patient on supportive therapies, including using a cool-mist vaporizer/humidifier/steam from hot showers, OTC throat lozenges, advancement of fluids as tolerated, nasal saline sprays, rest, OTC acetaminophen  or ibuprofen for pain control, frequent handwashing.  Follow Up Instructions: I discussed the assessment and treatment plan with the patient. The patient was provided an opportunity to ask questions and all were answered. The patient agreed with the plan and demonstrated an understanding of the instructions.  A copy of instructions were sent to the patient via MyChart unless otherwise noted below.     The patient was advised to call back or seek an in-person evaluation if the symptoms worsen or if the condition  fails to improve as anticipated.    Teena Shuck, PA-C

## 2024-05-08 NOTE — Patient Instructions (Signed)
  Vena LITTIE Quest, thank you for joining Teena Shuck, PA-C for today's virtual visit.  While this provider is not your primary care provider (PCP), if your PCP is located in our provider database this encounter information will be shared with them immediately following your visit.   A [facility] MyChart account gives you access to today's visit and all your visits, tests, and labs performed at [facility]  click here if you don't have a [facility] MyChart account or go to [email or web address]  Consent: (Patient) Valerie West provided verbal consent for this virtual visit at the beginning of the encounter.  Current Medications:  Current Outpatient Medications:    ALPRAZolam  (XANAX ) 0.25 MG tablet, Take 1 tablet (0.25 mg total) by mouth as needed for anxiety., Disp: 90 tablet, Rfl: 0   cephALEXin  (KEFLEX ) 500 MG capsule, Take 1 capsule (500 mg total) by mouth 4 (four) times daily., Disp: 28 capsule, Rfl: 0   citalopram  (CELEXA ) 10 MG tablet, Take 1 tablet (10 mg total) by mouth daily., Disp: 90 tablet, Rfl: 3   Multiple Vitamin (MULTIVITAMIN) capsule, Take by mouth., Disp: , Rfl:    mupirocin  ointment (BACTROBAN ) 2 %, Apply 1 Application topically 2 (two) times daily., Disp: 22 g, Rfl: 0   pantoprazole  (PROTONIX ) 40 MG tablet, Take 1 tablet (40 mg total) by mouth 2 (two) times daily. (Patient not taking: Reported on 12/03/2023), Disp: 60 tablet, Rfl: 0   rizatriptan  (MAXALT ) 10 MG tablet, Take 1 tablet (10 mg total) by mouth every 2 (two) hours as needed for migraine., Disp: 12 tablet, Rfl: 4   valACYclovir  (VALTREX ) 1000 MG tablet, Take 2 tablets (2,000 mg total) by mouth 2 (two) times daily. 2 po at onset of fever blister symptoms, then 2 po 12 hour later, Disp: 30 tablet, Rfl: 3   Medications ordered in this encounter:  No orders of the defined types were placed in this encounter.    *If you need refills on other medications prior to your next appointment, please contact  your pharmacy*  Follow-Up: Call back or seek an in-person evaluation if the symptoms worsen or if the condition fails to improve as anticipated.  [facility] [phone]  Other Instructions Please report to the nearest Emergency room with any worsening symptoms. Follow up with primary care provider (PCP) in 2 -3 days.    If you have been instructed to have an in-person evaluation today at a local Urgent Care facility, please use the link below. It will take you to a list of all of our available [facility], including address, phone number and hours of operation. Please do not delay care.  [facility]  If you or a family member do not have a primary care provider, use the link below to schedule a visit and establish care. When you choose a [facility] primary care physician or advanced practice provider, you gain a long-term partner in health. Find a Primary Care Provider  Learn more about [facility] in-office and virtual care options: [facility] - Get Care Now

## 2024-05-28 ENCOUNTER — Encounter: Payer: Self-pay | Admitting: Family Medicine

## 2024-05-28 ENCOUNTER — Other Ambulatory Visit (HOSPITAL_COMMUNITY): Payer: Self-pay

## 2024-05-28 ENCOUNTER — Ambulatory Visit: Admitting: Family Medicine

## 2024-05-28 ENCOUNTER — Telehealth: Payer: Self-pay

## 2024-05-28 VITALS — BP 104/78 | HR 79 | Temp 98.2°F | Ht 63.0 in | Wt 134.0 lb

## 2024-05-28 DIAGNOSIS — G43101 Migraine with aura, not intractable, with status migrainosus: Secondary | ICD-10-CM | POA: Diagnosis not present

## 2024-05-28 MED ORDER — UBRELVY 100 MG PO TABS: ORAL_TABLET | ORAL | 4 refills | Status: AC

## 2024-05-28 MED ORDER — KETOROLAC TROMETHAMINE 60 MG/2ML IM SOLN
30.0000 mg | Freq: Once | INTRAMUSCULAR | Status: AC
  Administered 2024-05-28: 30 mg via INTRAMUSCULAR

## 2024-05-28 NOTE — Telephone Encounter (Signed)
 Pharmacy Patient Advocate Encounter  Received notification from [facility] that Prior Authorization for Ubrogepant (UBRELVY) 100 MG TABS has been APPROVED from 05/28/2024 to 05/28/2025. Unable to obtain price due to refill too soon rejection, last fill date 05/28/2024 next available fill date11/08/2023.   PA #/Case ID/Reference #: [id number]

## 2024-05-28 NOTE — Telephone Encounter (Signed)
 Pharmacy Patient Advocate Encounter   Received notification from Onbase that prior authorization for Ubrogepant (UBRELVY) 100 MG TABS is required/requested.   Insurance verification completed.   The patient is insured through [facility].   Per test claim: PA required; PA submitted to above mentioned insurance via Latent Key/confirmation #/EOC [id number] Status is pending

## 2024-05-28 NOTE — Progress Notes (Signed)
 Established patient visit   Patient: Valerie West   DOB: 10-12-1985   38 y.o. Female  MRN: [id number] Visit Date: 05/28/2024  Today's healthcare provider: LAURAINE LOISE BUOY, DO   Chief Complaint  Patient presents with   Migraine    Patient has had migraine ongoing since Saturday pm. States Sunday and Monday were very intense pain, she as able to work Tuesday and Wednesday but it would worsen by the afternoon. Has never had a migraine that has lasted this long and been this painful. Patient is using migraine medication tylenol  and excedrin but no relief    Subjective    Migraine  This is a recurrent problem. The current episode started in the past 7 days. The problem occurs constantly. The problem has been waxing and waning. The pain is located in the Right unilateral region. The pain radiates to the right neck. The pain quality is similar to prior headaches. The quality of the pain is described as dull and pulsating. Associated symptoms include nausea, phonophobia, photophobia and a visual change (intermittent distrubances). Pertinent negatives include no blurred vision, dizziness, ear pain, sinus pressure or sore throat. The symptoms are aggravated by noise and menstrual cycle. She has tried acetaminophen , Excedrin and triptans for the symptoms. Her past medical history is significant for migraine headaches.   Valerie West is a 38 year old female with migraines who presents with a prolonged migraine episode.  She has been experiencing a migraine since Saturday afternoon. The migraine was debilitating on Sunday and Monday, improved slightly on Tuesday, but has persisted as a dull, constant pain. It intensified again on Tuesday and Wednesday, and although it is less intense today, it has not completely resolved.  The pain is localized to the right side of her head, occasionally extending to the base of her head. She describes the pain as dull with pulsing waves. This episode is similar  to her previous migraines in terms of light and noise sensitivity, but it is unusual in its duration. Typically, her migraines last a day followed by a 'migraine hangover' of dull pain, after which she feels better.  She has been using Maxalt , taking two to three tablets daily, which is more than her usual one tablet. She has also tried Excedrin, Tylenol , and increasing her fluid intake with Body Armor and Gatorade drinks. Initially, these treatments provided no relief, but since Tuesday, they have offered some temporary relief, although the pain returns after a few hours.  She experiences migraines primarily around her menstrual cycle, with one to two episodes per month. She has a history of using preventative treatments, that she cannot recall the name, but she has not used them in recent years as her condition improved after having children.  Associated symptoms include light and sound sensitivity, nausea, and visual disturbances, such as wanting to cover her right eye due to pain, though no persistent vision changes. No ear pain, sore throat, sinus pressure, congestion, and significant dizziness.      Medications: Outpatient Medications Prior to Visit  Medication Sig   ALPRAZolam  (XANAX ) 0.25 MG tablet Take 1 tablet (0.25 mg total) by mouth as needed for anxiety.   citalopram  (CELEXA ) 10 MG tablet Take 1 tablet (10 mg total) by mouth daily.   fluticasone  (FLONASE ) 50 MCG/ACT nasal spray Place 2 sprays into both nostrils daily.   Multiple Vitamin (MULTIVITAMIN) capsule Take by mouth.   Saline (AYR SALINE NASAL DROPS) 0.65 % SOLN Place 1 spray  into the nose daily.   [DISCONTINUED] rizatriptan  (MAXALT ) 10 MG tablet Take 1 tablet (10 mg total) by mouth every 2 (two) hours as needed for migraine.   [DISCONTINUED] valACYclovir  (VALTREX ) 1000 MG tablet Take 2 tablets (2,000 mg total) by mouth 2 (two) times daily. 2 po at onset of fever blister symptoms, then 2 po 12 hour later   [DISCONTINUED]  cephALEXin  (KEFLEX ) 500 MG capsule Take 1 capsule (500 mg total) by mouth 4 (four) times daily.   [DISCONTINUED] mupirocin  ointment (BACTROBAN ) 2 % Apply 1 Application topically 2 (two) times daily.   [DISCONTINUED] pantoprazole  (PROTONIX ) 40 MG tablet Take 1 tablet (40 mg total) by mouth 2 (two) times daily. (Patient not taking: Reported on 12/03/2023)   No facility-administered medications prior to visit.    Review of Systems  HENT:  Negative for ear pain, sinus pressure and sore throat.   Eyes:  Positive for photophobia. Negative for blurred vision.  Gastrointestinal:  Positive for nausea.  Neurological:  Negative for dizziness.        Objective    BP 104/78 (BP Location: Right Arm, Patient Position: Sitting, Cuff Size: Normal)   Pulse 79   Temp 98.2 F (36.8 C) (Oral)   Ht 5' 3 (1.6 m)   Wt 134 lb (60.8 kg)   LMP 05/07/2024 (Approximate)   SpO2 98%   BMI 23.74 kg/m     Physical Exam Vitals and nursing note reviewed.  Constitutional:      General: She is not in acute distress.    Appearance: Normal appearance.  HENT:     Head: Normocephalic and atraumatic.  Eyes:     General: No scleral icterus.    Conjunctiva/sclera: Conjunctivae normal.  Cardiovascular:     Rate and Rhythm: Normal rate.  Pulmonary:     Effort: Pulmonary effort is normal.  Neurological:     Mental Status: She is alert and oriented to person, place, and time. Mental status is at baseline.  Psychiatric:        Mood and Affect: Mood normal.        Behavior: Behavior normal.      No results found for any visits on 05/28/24.  Assessment & Plan    Migraine with aura and with status migrainosus, not intractable [facility]; Take 100 mg once daily as needed for migraines; may repeat dose x1 after 2 hours if needed.  Dispense: 16 tablet; Refill: 4 -     Ketorolac Tromethamine   Recurrent migraine with photophobia, phonophobia, nausea, and visual disturbances. Maxalt  provides partial relief.  No significant dizziness or neurological symptoms. Not on preventatives. Patient has prevously tried tylenol , excedrin migraine, sumitriptan and rizatriptan  without resolution. - Prescribe Ubrelvy 100 mg as noted. Provided patient with reduced copay card. - Administer ketorolac 30 mg IM today. - Recommended patient take 50 mg OTC benadryl  once home and not required to drive. - Will not administer promethazine as patient drove herself to clinic today and would be unable to safely drive home. Patient expressed understanding of this.    Return in about 2 months (around 08/11/2024), or if symptoms worsen or fail to improve, for CPE.      I discussed the assessment and treatment plan with the patient  The patient was provided an opportunity to ask questions and all were answered. The patient agreed with the plan and demonstrated an understanding of the instructions.   The patient was advised to call back or seek an  in-person evaluation if the symptoms worsen or if the condition fails to improve as anticipated.    Valerie LOISE BUOY, DO  [facility] [phone] (phone) [phone] (fax)  [facility]

## 2024-05-29 ENCOUNTER — Encounter: Payer: Self-pay | Admitting: Family Medicine

## 2024-05-29 DIAGNOSIS — G43109 Migraine with aura, not intractable, without status migrainosus: Secondary | ICD-10-CM

## 2024-05-30 MED ORDER — NURTEC 75 MG PO TBDP: 75.0000 mg | ORAL_TABLET | Freq: Every day | ORAL | 1 refills | Status: DC | PRN

## 2024-06-04 ENCOUNTER — Telehealth: Payer: Self-pay

## 2024-06-04 ENCOUNTER — Other Ambulatory Visit (HOSPITAL_COMMUNITY): Payer: Self-pay

## 2024-06-04 NOTE — Telephone Encounter (Signed)
 Pharmacy Patient Advocate Encounter  Received notification from [facility] that Prior Authorization for Nurtec has been PARTIALLY  APPROVED FOR  16 TABS FOR 30 DAYS. Ran test claim, Copay is $RTS, RX WAS LAST FILLED ON TODAY 10.16.25. This test claim was processed through [facility]- copay amounts may vary at other pharmacies due to pharmacy/plan contracts, or as the patient moves through the different stages of their insurance plan.   PA #/Case ID/Reference #: [id number]

## 2024-06-04 NOTE — Telephone Encounter (Signed)
 Pharmacy Patient Advocate Encounter   Received notification from Onbase that prior authorization for Nurtec 75MG  dispersible tabletsis required/requested.   Insurance verification completed.   The patient is insured through [facility].   Per test claim: PA required; PA submitted to above mentioned insurance via Latent Key/confirmation #/EOC [id number] Status is pending

## 2024-06-05 ENCOUNTER — Other Ambulatory Visit (HOSPITAL_COMMUNITY): Payer: Self-pay

## 2024-06-08 ENCOUNTER — Encounter: Payer: Self-pay | Admitting: Family Medicine

## 2024-06-08 DIAGNOSIS — G43109 Migraine with aura, not intractable, without status migrainosus: Secondary | ICD-10-CM

## 2024-06-11 MED ORDER — VENLAFAXINE HCL ER 37.5 MG PO CP24: 37.5000 mg | ORAL_CAPSULE | Freq: Every day | ORAL | 0 refills | Status: DC

## 2024-08-02 ENCOUNTER — Ambulatory Visit
Admission: RE | Admit: 2024-08-02 | Discharge: 2024-08-02 | Disposition: A | Discharge status: Home / Self Care | Attending: Emergency Medicine

## 2024-08-02 ENCOUNTER — Other Ambulatory Visit: Payer: Self-pay

## 2024-08-02 ENCOUNTER — Ambulatory Visit (INDEPENDENT_AMBULATORY_CARE_PROVIDER_SITE_OTHER)

## 2024-08-02 VITALS — BP 106/70 | HR 80 | Temp 98.0°F | Resp 19

## 2024-08-02 DIAGNOSIS — R058 Other specified cough: Secondary | ICD-10-CM

## 2024-08-02 DIAGNOSIS — J069 Acute upper respiratory infection, unspecified: Secondary | ICD-10-CM

## 2024-08-02 MED ORDER — AMOXICILLIN-POT CLAVULANATE 875-125 MG PO TABS: 1.0000 | ORAL_TABLET | Freq: Two times a day (BID) | ORAL | 0 refills | Status: DC

## 2024-08-02 NOTE — Discharge Instructions (Signed)
 Your chest x-ray is pending.  I will call you with the result.    Take the Augmentin  as directed.    Follow up with your primary care provider tomorrow.  Go to the emergency department if you have worsening symptoms.

## 2024-08-02 NOTE — ED Triage Notes (Signed)
 Pt being seen in UC for cough x1 week and chest discomfort x 4 days.  Pt denies fevers, sore throat, nasal congestion.  Pt reports taking dayquil, nyquil, and delsym with no relief.

## 2024-08-02 NOTE — ED Provider Notes (Signed)
 CAY RALPH PELT    CSN: [id number] Arrival date & time: 08/02/24  1038      History   Chief Complaint Chief Complaint  Patient presents with   Cough    Entered by patient    HPI Valerie West is a 38 y.o. female.  Patient presents with >1 week history of cough.  Her cough has gotten worse in the last 4 days; it is productive and she has chest discomfort with her cough.  No fever or shortness of breath.  She has been taking DayQuil, NyQuil, Delsym.  Her medical history includes lung cancer with lobectomy in 2018.  The history is provided by the patient and medical records.    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   Migraine with aura and without status migrainosus, not intractable 08/09/2021   GAD (generalized anxiety disorder) 08/09/2021   Annual physical exam 08/09/2021    Past Surgical History:  Procedure Laterality Date   APPENDECTOMY  06/2016   ESOPHAGOGASTRODUODENOSCOPY (EGD) WITH PROPOFOL  N/A 07/02/2019   Procedure: ESOPHAGOGASTRODUODENOSCOPY (EGD) WITH PROPOFOL ;  Surgeon: Therisa Bi, MD;  Location: [facility] ENDOSCOPY;  Service: Gastroenterology;  Laterality: N/A;   LAPAROSCOPIC APPENDECTOMY N/A 06/23/2016   Procedure: APPENDECTOMY LAPAROSCOPIC;  Surgeon: Carlin Pastel, MD;  Location: [facility] ORS;  Service: General;  Laterality: N/A;   LOBECTOMY Left 12/07/2016    OB History     Gravida  2   Para  2   Term      Preterm      AB      Living         SAB      IAB      Ectopic      Multiple      Live Births               Home Medications    Prior to Admission medications  Medication Sig Start Date End Date Taking?  Authorizing Provider  amoxicillin -clavulanate (AUGMENTIN ) 875-125 MG tablet Take 1 tablet by mouth every 12 (twelve) hours. 08/02/24  Yes Corlis Burnard DEL, NP  ALPRAZolam  (XANAX ) 0.25 MG tablet Take 1 tablet (0.25 mg total) by mouth as needed for anxiety. 08/12/23   Emilio Matrice Herro DASEN, FNP  citalopram  (CELEXA ) 10 MG tablet Take 1 tablet (10 mg total) by mouth daily. 08/12/23   Emilio Annise Boran DASEN, FNP  fluticasone  (FLONASE ) 50 MCG/ACT nasal spray Place 2 sprays into both nostrils daily. Patient not taking: Reported on 08/02/2024 05/08/24   Rolan Berthold, PA-C  Multiple Vitamin (MULTIVITAMIN) capsule Take by mouth.    [provider]  Rimegepant Sulfate (NURTEC) 75 MG TBDP Take 1 tablet (75 mg total) by mouth daily as needed (migraine). Patient not taking: Reported on 08/02/2024 05/30/24   Donzella Lauraine SAILOR, DO  Saline (AYR SALINE NASAL DROPS) 0.65 % SOLN Place 1 spray into the nose daily. Patient not taking: Reported on 08/02/2024 05/08/24   Rolan Berthold, PA-C  Ubrogepant  (UBRELVY ) 100 MG TABS Take 100 mg once daily as needed for migraines; may repeat dose x1 after 2 hours if needed. 05/28/24   Donzella Lauraine SAILOR, DO  venlafaxine  XR (EFFEXOR -XR)  37.5 MG 24 hr capsule Take 1 capsule (37.5 mg total) by mouth daily with breakfast. Patient not taking: Reported on 08/02/2024 06/11/24   Donzella Lauraine SAILOR, DO    Family History Family History  Problem Relation Age of Onset   Depression Father    Bipolar disorder Father    Alcohol abuse Father    Drug abuse Father    Cancer Maternal Grandmother    Diabetes Maternal Grandmother    Diabetes Maternal Grandfather    Cancer Maternal Grandfather    Hypertension Mother     Social History Social History[1]   Allergies   Patient has no known allergies.   Review of Systems Review of Systems  Constitutional:  Negative for chills and fever.  HENT:  Positive for congestion. Negative for ear pain and sore throat.   Respiratory:  Positive for cough and chest  tightness. Negative for shortness of breath.   Cardiovascular:  Negative for chest pain and palpitations.     Physical Exam Triage Vital Signs ED Triage Vitals  Encounter Vitals Group     BP 08/02/24 1046 106/70     Girls Systolic BP Percentile --      Girls Diastolic BP Percentile --      Boys Systolic BP Percentile --      Boys Diastolic BP Percentile --      Pulse Rate 08/02/24 1046 80     Resp 08/02/24 1046 19     Temp 08/02/24 1046 98 F (36.7 C)     Temp Source 08/02/24 1046 Oral     SpO2 08/02/24 1046 97 %     Weight --      Height --      Head Circumference --      Peak Flow --      Pain Score 08/02/24 1044 2     Pain Loc --      Pain Education --      Exclude from Growth Chart --    No data found.  Updated Vital Signs BP 106/70 (BP Location: Right Arm)   Pulse 80   Temp 98 F (36.7 C) (Oral)   Resp 19   LMP 07/19/2024 (Exact Date)   SpO2 97%   Visual Acuity Right Eye Distance:   Left Eye Distance:   Bilateral Distance:    Right Eye Near:   Left Eye Near:    Bilateral Near:     Physical Exam Constitutional:      General: She is not in acute distress. HENT:     Right Ear: Tympanic membrane normal.     Left Ear: Tympanic membrane normal.     Nose: Nose normal.     Mouth/Throat:     Mouth: Mucous membranes are moist.     Pharynx: Oropharynx is clear.  Cardiovascular:     Rate and Rhythm: Normal rate and regular rhythm.     Heart sounds: Normal heart sounds.  Pulmonary:     Effort: Pulmonary effort is normal. No respiratory distress.     Breath sounds: Rhonchi present.     Comments: Rhonchi on left. Neurological:     Mental Status: She is alert.      UC Treatments / Results  Labs (all labs ordered are listed, but only abnormal results are displayed) Labs Reviewed - No data to display  EKG   Radiology DG Chest 2 View Result Date: 08/02/2024 CLINICAL DATA:  productive cough, rhonchi.  hx of lobectomy, lung CA EXAM: CHEST -  2 VIEW  COMPARISON:  November 22, 2017 FINDINGS: The cardiomediastinal silhouette is unchanged in contour. Postsurgical changes along the LEFT lung. No pleural effusion. No pneumothorax. No acute pleuroparenchymal abnormality. Visualized abdomen is unremarkable. No acute osseous abnormality noted. IMPRESSION: No acute cardiopulmonary abnormality. Electronically Signed   By: Corean Salter M.D.   On: 08/02/2024 11:55    Procedures Procedures (including critical care time)  Medications Ordered in UC Medications - No data to display  Initial Impression / Assessment and Plan / UC Course  I have reviewed the triage vital signs and the nursing notes.  Pertinent labs & imaging results that were available during my care of the patient were reviewed by me and considered in my medical decision making (see chart for details).   Acute upper respiratory infection.  Afebrile and vital signs are stable.  O2 sat 97% on room air.  Rhonchi noted in left lung.  CXR negative.  Treating with Augmentin .  Education provided on upper respiratory infection.  Instructed patient to follow-up with her PCP tomorrow.  ED precautions given.  She agrees to plan of care.  Final Clinical Impressions(s) / UC Diagnoses   Final diagnoses:  Acute upper respiratory infection     Discharge Instructions      Your chest x-ray is pending.  I will call you with the result.    Take the Augmentin  as directed.    Follow up with your primary care provider tomorrow.  Go to the emergency department if you have worsening symptoms.        ED Prescriptions     Medication Sig Dispense Auth. Provider   amoxicillin -clavulanate (AUGMENTIN ) 875-125 MG tablet Take 1 tablet by mouth every 12 (twelve) hours. 14 tablet Corlis Burnard DEL, NP      PDMP not reviewed this encounter.     [1]  Social History Tobacco Use   Smoking status: Never   Smokeless tobacco: Never  Vaping Use   Vaping status: Never Used  Substance Use Topics   Alcohol  use: Yes    Alcohol/week: 0.0 standard drinks of alcohol    Comment: occassional   Drug use: No     Corlis Burnard DEL, NP 08/02/24 1216

## 2024-08-03 ENCOUNTER — Ambulatory Visit (HOSPITAL_COMMUNITY): Payer: Self-pay

## 2024-08-03 ENCOUNTER — Encounter: Payer: Self-pay | Admitting: Family Medicine

## 2024-08-03 ENCOUNTER — Ambulatory Visit: Admitting: Family Medicine

## 2024-08-03 VITALS — BP 96/64 | HR 74 | Temp 98.2°F | Ht 62.0 in | Wt 129.8 lb

## 2024-08-03 DIAGNOSIS — R051 Acute cough: Secondary | ICD-10-CM | POA: Diagnosis not present

## 2024-08-03 DIAGNOSIS — J189 Pneumonia, unspecified organism: Secondary | ICD-10-CM | POA: Diagnosis not present

## 2024-08-03 LAB — POCT INFLUENZA A/B
Influenza A, POC: NEGATIVE
Influenza B, POC: NEGATIVE

## 2024-08-03 LAB — POC COVID19 BINAXNOW: SARS Coronavirus 2 Ag: NEGATIVE

## 2024-08-03 MED ORDER — BENZONATATE 100 MG PO CAPS: 100.0000 mg | ORAL_CAPSULE | Freq: Two times a day (BID) | ORAL | 0 refills | Status: DC | PRN

## 2024-08-03 MED ORDER — AZITHROMYCIN 250 MG PO TABS: ORAL_TABLET | ORAL | 0 refills | Status: AC

## 2024-08-03 MED ORDER — ALBUTEROL SULFATE HFA 108 (90 BASE) MCG/ACT IN AERS: 2.0000 | INHALATION_SPRAY | Freq: Four times a day (QID) | RESPIRATORY_TRACT | 0 refills | Status: DC | PRN

## 2024-08-03 NOTE — Progress Notes (Signed)
 Established patient visit   Patient: Valerie West   DOB: 07-04-1986   39 y.o. Female  MRN: [id number] Visit Date: 08/03/2024  Today's healthcare provider: LAURAINE LOISE BUOY, DO   Chief Complaint  Patient presents with   Acute Visit    Patient went to urgent care yesterday, made follow up with PCP today.  A chest xray was done and it was fine, was diagnosed with a URI.  Reports she has only had a cough and it is prductive.  Was put on Augmentin .   Cough for about a week and the productive cough with pain since Thursday.  States that she is feeling pain and tightness in her chest and on her left side.   Subjective    HPI Valerie West is a 38 year old female with a history of left lower lobe resection for cancer who presents with a productive cough and chest pain.  She has been experiencing a cough for over a week, with the productive cough starting last Thursday. The cough is accompanied by a sensation of 'rattling' and 'vibration' in her chest when breathing in or out, which began on Saturday. The cough varies in intensity, with some moments being quite intense, leading to coughing up mucus. She experiences shortness of breath when moving around, such as walking up and down stairs, but not at rest.  She was seen at urgent care yesterday, where a chest x-ray was performed and found to be normal. Despite this, due to her clinical presentation, she was started on Augmentin . She began the antibiotic yesterday and has taken a total of 3 doses, with the third dose this morning. She has been using over-the-counter medications such as DayQuil and NyQuil, containing guaifenesin, phenylephrine , and dextromethorphan, approximately every four hours, though she has not taken any today.  No ear pain, sore throat, fevers, chills, or diarrhea. No sinus drainage, pressure, or allergies. She is not experiencing nausea or belly pain, but reports a lack of appetite and is pushing fluids despite this. She  has not received any vaccines this year.  She has a history of left lower lobe resection in 2018 for cancer, which has resulted in the upper lobe filling the cavity post-surgery. She has used an inhaler in the past, though it has been a long time.     Last Pap - 09/16/2019 - NILM, HPV negative    Medications: Show/hide medication list[1]  Review of Systems  Constitutional:  Positive for appetite change (Poor) and fatigue. Negative for chills and fever.  HENT:  Negative for sore throat.   Respiratory:  Positive for cough and shortness of breath (mild, on exertion). Negative for chest tightness.   Cardiovascular:  Negative for chest pain and palpitations.  Gastrointestinal:  Negative for diarrhea and nausea.  Neurological:  Negative for dizziness and weakness.        Objective    BP 96/64 (BP Location: Left Arm, Patient Position: Sitting, Cuff Size: Normal)   Pulse 74   Temp 98.2 F (36.8 C) (Oral)   Ht 5' 2 (1.575 m)   Wt 129 lb 12.8 oz (58.9 kg)   LMP 07/19/2024 (Exact Date)   SpO2 99%   BMI 23.74 kg/m     Physical Exam Constitutional:      Appearance: Normal appearance.  HENT:     Head: Normocephalic and atraumatic.  Eyes:     General: No scleral icterus.    Extraocular Movements: Extraocular movements intact.  Conjunctiva/sclera: Conjunctivae normal.  Cardiovascular:     Rate and Rhythm: Normal rate and regular rhythm.     Pulses: Normal pulses.     Heart sounds: Normal heart sounds.  Pulmonary:     Effort: Pulmonary effort is normal. No respiratory distress.     Breath sounds: Rhonchi (left upper lobe) present.  Abdominal:     General: Bowel sounds are normal. There is no distension.     Palpations: Abdomen is soft. There is no mass.     Tenderness: There is no abdominal tenderness. There is no guarding.  Musculoskeletal:     Right lower leg: No edema.     Left lower leg: No edema.  Skin:    General: Skin is warm and dry.  Neurological:      Mental Status: She is alert and oriented to person, place, and time. Mental status is at baseline.  Psychiatric:        Mood and Affect: Mood normal.        Behavior: Behavior normal.      Results for orders placed or performed in visit on 08/03/24  POCT Influenza A/B  Result Value Ref Range   Influenza A, POC Negative Negative   Influenza B, POC Negative Negative  POC COVID-19 BinaxNow  Result Value Ref Range   SARS Coronavirus 2 Ag Negative Negative    Assessment & Plan    Acute cough -     POCT Influenza A/B -     POC COVID-19 BinaxNow -     Benzonatate ; Take 1 capsule (100 mg total) by mouth 2 (two) times daily as needed for cough.  Dispense: 20 capsule; Refill: 0  Community acquired pneumonia of left upper lobe of lung -     Azithromycin ; Take 2 tablets on day 1, then 1 tablet daily on days 2 through 5  Dispense: 6 tablet; Refill: 0 -     Albuterol  Sulfate HFA; Inhale 2 puffs into the lungs every 6 (six) hours as needed for wheezing or shortness of breath.  Dispense: 8 g; Refill: 0     Community acquired pneumonia of left upper lobe of lung High suspicion for pneumonia despite normal chest x-ray due to bronchial sounds and history of left lower lobectomy. Augmentin  started, azithromycin  added for broader coverage. - Continue Augmentin . - Start azithromycin . - Prescribe Tessalon  Perles for cough. - Prescribe inhaler for bronchial support. - Advise rest and increased fluids. - Provide work note until December 18th, 2025.  Will also provide notes to excuse her through the 19th and 20th, in case she is not improving. - Instruct to seek medical attention if symptoms worsen or persist by Thursday.  History of left lower lobectomy Previous lobectomy increases concern for pneumonia in remaining left upper lobe.     Return if symptoms worsen or fail to improve.      I discussed the assessment and treatment plan with the patient  The patient was provided an opportunity to  ask questions and all were answered. The patient agreed with the plan and demonstrated an understanding of the instructions.   The patient was advised to call back or seek an in-person evaluation if the symptoms worsen or if the condition fails to improve as anticipated.    LAURAINE LOISE BUOY, DO  [facility] [phone] (phone) [phone] (fax)  [facility]    [1]  Outpatient Medications Prior to Visit  Medication Sig   ALPRAZolam  (XANAX ) 0.25 MG tablet  Take 1 tablet (0.25 mg total) by mouth as needed for anxiety.   amoxicillin -clavulanate (AUGMENTIN ) 875-125 MG tablet Take 1 tablet by mouth every 12 (twelve) hours.   citalopram  (CELEXA ) 10 MG tablet Take 1 tablet (10 mg total) by mouth daily.   Multiple Vitamin (MULTIVITAMIN) capsule Take by mouth.   Ubrogepant  (UBRELVY ) 100 MG TABS Take 100 mg once daily as needed for migraines; may repeat dose x1 after 2 hours if needed.   [DISCONTINUED] fluticasone  (FLONASE ) 50 MCG/ACT nasal spray Place 2 sprays into both nostrils daily. (Patient not taking: Reported on 08/02/2024)   [DISCONTINUED] Rimegepant Sulfate (NURTEC) 75 MG TBDP Take 1 tablet (75 mg total) by mouth daily as needed (migraine). (Patient not taking: Reported on 08/02/2024)   [DISCONTINUED] Saline (AYR SALINE NASAL DROPS) 0.65 % SOLN Place 1 spray into the nose daily. (Patient not taking: Reported on 08/02/2024)   [DISCONTINUED] venlafaxine  XR (EFFEXOR -XR) 37.5 MG 24 hr capsule Take 1 capsule (37.5 mg total) by mouth daily with breakfast. (Patient not taking: Reported on 08/02/2024)   No facility-administered medications prior to visit.

## 2024-08-05 ENCOUNTER — Encounter: Payer: Self-pay | Admitting: Family Medicine

## 2024-08-17 ENCOUNTER — Ambulatory Visit: Admitting: Family Medicine

## 2024-08-17 ENCOUNTER — Encounter: Payer: Self-pay | Admitting: Family Medicine

## 2024-08-17 VITALS — BP 95/67 | HR 82 | Temp 98.2°F | Ht 63.0 in | Wt 130.2 lb

## 2024-08-17 DIAGNOSIS — F411 Generalized anxiety disorder: Secondary | ICD-10-CM

## 2024-08-17 DIAGNOSIS — G43101 Migraine with aura, not intractable, with status migrainosus: Secondary | ICD-10-CM | POA: Diagnosis not present

## 2024-08-17 DIAGNOSIS — Z0000 Encounter for general adult medical examination without abnormal findings: Secondary | ICD-10-CM

## 2024-08-17 DIAGNOSIS — Z23 Encounter for immunization: Secondary | ICD-10-CM

## 2024-08-17 MED ORDER — ALPRAZOLAM 0.25 MG PO TABS: 0.2500 mg | ORAL_TABLET | ORAL | 0 refills | Status: DC | PRN

## 2024-08-17 MED ORDER — ALPRAZOLAM 0.25 MG PO TABS: 0.2500 mg | ORAL_TABLET | Freq: Every day | ORAL | 0 refills | Status: AC | PRN

## 2024-08-17 MED ORDER — CITALOPRAM HYDROBROMIDE 10 MG PO TABS: 10.0000 mg | ORAL_TABLET | Freq: Every day | ORAL | 3 refills | Status: AC

## 2024-08-17 NOTE — Progress Notes (Signed)
 "    Complete physical exam   Patient: Valerie West   DOB: 1986/08/16   38 y.o. Female  MRN: [id number] Visit Date: 08/17/2024  Today's healthcare provider: LAURAINE LOISE BUOY, DO   Chief Complaint  Patient presents with   Annual Exam    Last CPE- 08/12/2023 Diet- General Exercise- Yes Overall feeling- Pretty good some coughing from pneumonia Sleep- Not great, believes it is due to being sick Concerns- Wants to check in on migraines.  Cervical Cancer Screening- [facility] request results  Already has order but in for mammogram.  Tdap- yes Other vaccines- declined   Subjective    Valerie West is a 38 y.o. female who presents today for a complete physical exam.   HPI HPI     Annual Exam    Additional comments: Last CPE- 08/12/2023 Diet- General Exercise- Yes Overall feeling- Pretty good some coughing from pneumonia Sleep- Not great, believes it is due to being sick Concerns- Wants to check in on migraines.  Cervical Cancer Screening- [facility] request results  Already has order but in for mammogram.  Tdap- yes Other vaccines- declined      Last edited by Terrel Powell West, CMA on 08/17/2024  8:46 AM.       Valerie West Quest is a 38 year old female who presents for a physical exam and to discuss her migraines.  She has a history of migraines, which have become more frequent and severe in recent months. October was particularly challenging with multiple episodes, and December included a debilitating three-day episode. She uses Ubrelvy , which she finds effective, but is concerned about the frequency of her usage, having taken it five times in December and experiencing two severe migraines in November. Previously, her migraines were more predictable, often related to hormonal changes and weather, but now they occur more randomly. She has not seen a neurologist in years as her migraines had been manageable until recently.  She has been experiencing a lingering cough following a  recent respiratory illness. The intense symptoms have resolved, but she still feels slightly short of breath.  She experienced nausea and vomiting last week, attributed to antibiotics, but these symptoms have resolved.   Her sleep has been disrupted recently, which she attributes to her illness and her daughter's recent sickness. Her husband has noticed she snores when she first falls asleep but not throughout the night. She denies any correlation between stress and her migraines, noting that she has been tracking them since October and has not identified a specific pattern related to stress or time of day.     Past Medical History:  Diagnosis Date   Anxiety    Cancer (HCC) 06/2016   Complication of anesthesia    Depression during pregnancy, antepartum unk   PONV (postoperative nausea and vomiting)    Ulcer 2020   Past Surgical History:  Procedure Laterality Date   APPENDECTOMY  06/2016   ESOPHAGOGASTRODUODENOSCOPY (EGD) WITH PROPOFOL  N/A 07/02/2019   Procedure: ESOPHAGOGASTRODUODENOSCOPY (EGD) WITH PROPOFOL ;  Surgeon: Therisa Bi, MD;  Location: [facility] ENDOSCOPY;  Service: Gastroenterology;  Laterality: N/A;   LAPAROSCOPIC APPENDECTOMY N/A 06/23/2016   Procedure: APPENDECTOMY LAPAROSCOPIC;  Surgeon: Carlin Pastel, MD;  Location: [facility] ORS;  Service: General;  Laterality: N/A;   LOBECTOMY Left 12/07/2016   Social History   Socioeconomic History   Marital status: Married    Spouse name: Darina   Number of children: 2   Years of education: 17   Highest education level: Not on  file  Occupational History   Occupation: Magazine Features Editor: [employer]  Tobacco Use   Smoking status: Never   Smokeless tobacco: Never  Vaping Use   Vaping status: Never Used  Substance and Sexual Activity   Alcohol use: Yes    Comment: Socially drink (1-2 a month)   Drug use: Never   Sexual activity: Yes    Birth control/protection: Surgical  Other Topics Concern   Not on file  Social History Narrative    Not on file   Social Drivers of Health   Tobacco Use: Low Risk (08/17/2024)   Patient History    Smoking Tobacco Use: Never    Smokeless Tobacco Use: Never    Passive Exposure: Not on file  Financial Resource Strain: Low Risk  (04/30/2024)   Received from [facility]   Overall Financial Resource Strain (CARDIA)    Difficulty of Paying Living Expenses: Not hard at all  Food Insecurity: No Food Insecurity (04/30/2024)   Received from [facility]   Epic    Within the past 12 months, you worried that your food would run out before you got the money to buy more.: Never true    Within the past 12 months, the food you bought just didn't last and you didn't have money to get more.: Never true  Transportation Needs: No Transportation Needs (04/30/2024)   [facility] from [facility] - Transportation    In the past 12 months, has lack of transportation kept you from medical appointments or from getting medications?: No    Lack of Transportation (Non-Medical): No  Physical Activity: Not on file  Stress: Not on file  Social Connections: Not on file  Intimate Partner Violence: Not on file  Depression (PHQ2-9): Low Risk (08/17/2024)   Depression (PHQ2-9)    PHQ-2 Score: 4  Recent Concern: Depression (PHQ2-9) - Medium Risk (05/28/2024)   Depression (PHQ2-9)    PHQ-2 Score: 6  Alcohol Screen: Low Risk (08/09/2022)   Alcohol Screen    Last Alcohol Screening Score (AUDIT): 1  Housing: Low Risk  (04/30/2024)   Received from [facility]   Epic    In the last 12 months, was there a time when you were not able to pay the mortgage or rent on time?: No    In the past 12 months, how many times have you moved where you were living?: 0    At any time in the past 12 months, were you homeless or living in a shelter (including now)?: No  Utilities: Not At Risk (04/30/2024)   Received from [facility]   Epic     In the past 12 months has the electric, gas, oil, or water company threatened to shut off services in your home?: No  Health Literacy: Not on file   Family Status  Relation Name Status   Father Salomon Searles   MGM  Alive   MGF Eddie Alive   Mother Channing Searles  No partnership data on file   Family History  Problem Relation Age of Onset   Depression Father    Bipolar disorder Father    Alcohol abuse Father    Drug abuse Father    Cancer Maternal Grandmother    Diabetes Maternal Grandmother    Diabetes Maternal Grandfather    Cancer Maternal Grandfather    Hypertension Mother    Allergies[1]  Patient Care Team: Donzella Lauraine SAILOR, DO  as PCP - General (Family Medicine)   Medications: Show/hide medication list[2]  Review of Systems  Constitutional:  Negative for chills, fatigue and fever.  HENT:  Negative for congestion, ear pain, rhinorrhea, sneezing and sore throat.   Eyes: Negative.  Negative for pain, redness and visual disturbance.  Respiratory:  Positive for cough and shortness of breath (Mild, improving). Negative for wheezing.   Cardiovascular:  Negative for chest pain and leg swelling.  Gastrointestinal:  Negative for abdominal pain, blood in stool, constipation, diarrhea and nausea.  Endocrine: Negative for polydipsia and polyphagia.  Genitourinary: Negative.  Negative for dysuria, flank pain, hematuria, pelvic pain, vaginal bleeding and vaginal discharge.  Musculoskeletal:  Negative for arthralgias, back pain, gait problem and joint swelling.  Skin:  Negative for rash.  Neurological:  Positive for headaches (migraines). Negative for dizziness, tremors, seizures, weakness, light-headedness and numbness.  Hematological:  Negative for adenopathy.  Psychiatric/Behavioral: Negative.  Negative for behavioral problems, confusion and dysphoric mood. The patient is not nervous/anxious and is not hyperactive.       Objective    BP 95/67 (BP Location: Left Arm, Patient  Position: Sitting, Cuff Size: Normal)   Pulse 82   Temp 98.2 F (36.8 C) (Oral)   Ht 5' 3 (1.6 m)   Wt 130 lb 3.2 oz (59.1 kg)   LMP 07/19/2024 (Exact Date)   SpO2 100%   BMI 23.06 kg/m    Physical Exam Vitals and nursing note reviewed.  Constitutional:      General: She is awake.     Appearance: Normal appearance.  HENT:     Head: Normocephalic and atraumatic.     Right Ear: Tympanic membrane, ear canal and external ear normal.     Left Ear: Tympanic membrane, ear canal and external ear normal.     Nose: Nose normal.     Mouth/Throat:     Mouth: Mucous membranes are moist.     Pharynx: Oropharynx is clear. No oropharyngeal exudate or posterior oropharyngeal erythema.  Eyes:     General: No scleral icterus.    Extraocular Movements: Extraocular movements intact.     Conjunctiva/sclera: Conjunctivae normal.     Pupils: Pupils are equal, round, and reactive to light.  Neck:     Thyroid: No thyromegaly or thyroid tenderness.  Cardiovascular:     Rate and Rhythm: Normal rate and regular rhythm.     Pulses: Normal pulses.     Heart sounds: Normal heart sounds.  Pulmonary:     Effort: Pulmonary effort is normal. No tachypnea, bradypnea or respiratory distress.     Breath sounds: Normal breath sounds. No stridor. No wheezing, rhonchi or rales.  Abdominal:     General: Bowel sounds are normal. There is no distension.     Palpations: Abdomen is soft. There is no mass.     Tenderness: There is no abdominal tenderness. There is no guarding.     Hernia: No hernia is present.  Musculoskeletal:     Cervical back: Normal range of motion and neck supple.     Right lower leg: No edema.     Left lower leg: No edema.  Lymphadenopathy:     Cervical: No cervical adenopathy.  Skin:    General: Skin is warm and dry.  Neurological:     Mental Status: She is alert and oriented to person, place, and time. Mental status is at baseline.  Psychiatric:        Mood and Affect: Mood normal.  Behavior: Behavior normal.      Last depression screening scores    08/17/2024    8:51 AM 08/03/2024   10:29 AM 05/28/2024    9:27 AM  PHQ 2/9 Scores  PHQ - 2 Score 1 0 2  PHQ- 9 Score 4 4 6       Data saved with a previous flowsheet row definition   Last fall risk screening    08/17/2024    8:51 AM  Fall Risk   Falls in the past year? 0  Number falls in past yr: 0  Injury with Fall? 0  Risk for fall due to : No Fall Risks   Last Audit-C alcohol use screening    08/09/2022   10:12 AM  Alcohol Use Disorder Test (AUDIT)  1. How often do you have a drink containing alcohol? 1  2. How many drinks containing alcohol do you have on a typical day when you are drinking? 0  3. How often do you have six or more drinks on one occasion? 0  AUDIT-C Score 1   A score of 3 or more in women, and 4 or more in men indicates increased risk for alcohol abuse, EXCEPT if all of the points are from question 1   No results found for any visits on 08/17/24.  Assessment & Plan    Routine Health Maintenance and Physical Exam  Exercise Activities and Dietary recommendations  Goals   None     Immunization History  Administered Date(s) Administered   Influenza,inj,Quad PF,6+ Mos 06/15/2015, 06/17/2017, 07/24/2019, 08/09/2022   Influenza-Unspecified 05/28/2018   Tdap 12/22/2013, 08/17/2024    Health Maintenance  Topic Date Due   Cervical Cancer Screening (HPV/Pap Cotest)  11/15/2024 (Originally 09/15/2022)   Influenza Vaccine  11/17/2024 (Originally 03/20/2024)   COVID-19 Vaccine (1) 01/18/2025 (Originally 10/17/1986)   Mammogram  02/15/2025 (Originally 04/16/2024)   Hepatitis B Vaccines 19-59 Average Risk (1 of 3 - 19+ 3-dose series) 08/17/2025 (Originally 04/17/2003)   HPV VACCINES (1 - Risk 3-dose SCDM series) 08/17/2025 (Originally 04/17/2011)   DTaP/Tdap/Td (3 - Td or Tdap) 08/17/2034   Hepatitis C Screening  Completed   HIV Screening  Completed   Pneumococcal Vaccine  Aged Out    Meningococcal B Vaccine  Aged Out    Discussed health benefits of physical activity, and encouraged her to engage in regular exercise appropriate for her age and condition.   Annual physical exam  Migraine with aura and with status migrainosus, not intractable  GAD (generalized anxiety disorder) -     Citalopram  Hydrobromide; Take 1 tablet (10 mg total) by mouth daily.  Dispense: 90 tablet; Refill: 3 -     ALPRAZolam ; Take 1 tablet (0.25 mg total) by mouth daily as needed for anxiety.  Dispense: 90 tablet; Refill: 0  Need for Tdap vaccination -     Tdap vaccine greater than or equal to 7yo IM     Annual physical exam Physical exam overall unremarkable except as noted above. Routine lab work ordered as noted. Tetanus vaccination due, mammogram pending, recent blood work unremarkable.  Pap smear up-to-date; requested record.  Patient declined HPV and hepatitis B vaccine - Administered tetanus vaccination. - Complete mammogram as ordered.  Migraine with aura and with status migrainosus, not intractable Increased frequency and severity of migraine episodes. Ubrelvy  effective for acute management but requires multiple doses. Preventative options limited due to venlafaxine  interaction and insurance issues with Nurtec.  Occasional morning headache but sleep apnea less likely contributing due  to minimal symptoms. - Continue Ubrelvy  for acute management, max 16 tablets/month. - Consider alternative preventatives if migraines occur more frequently - Consider neurology referral for injectable preventatives if episodes persist/become more severe. - Monitor for sleep apnea symptoms.  Generalized anxiety disorder Chronic, overall well-controlled on Celexa  10 mg daily.  Continue alprazolam  0.25 mg  Post-respiratory infection with lingering cough Residual cough persists post-infection. No chest pain, some shortness of breath, resolved gastrointestinal symptoms post-antibiotics.    Return  in about 6 months (around 02/15/2025) for Anx/Dep, Chronic f/u w/next provider.     I discussed the assessment and treatment plan with the patient  The patient was provided an opportunity to ask questions and all were answered. The patient agreed with the plan and demonstrated an understanding of the instructions.   The patient was advised to call back or seek an in-person evaluation if the symptoms worsen or if the condition fails to improve as anticipated.    LAURAINE LOISE BUOY, DO  [facility] [phone] (phone) [phone] (fax)  [facility]    [1] No Known Allergies [2]  Outpatient Medications Prior to Visit  Medication Sig   Multiple Vitamin (MULTIVITAMIN) capsule Take by mouth.   Ubrogepant  (UBRELVY ) 100 MG TABS Take 100 mg once daily as needed for migraines; may repeat dose x1 after 2 hours if needed.   [DISCONTINUED] albuterol  (VENTOLIN  HFA) 108 (90 Base) MCG/ACT inhaler Inhale 2 puffs into the lungs every 6 (six) hours as needed for wheezing or shortness of breath.   [DISCONTINUED] ALPRAZolam  (XANAX ) 0.25 MG tablet Take 1 tablet (0.25 mg total) by mouth as needed for anxiety.   [DISCONTINUED] amoxicillin -clavulanate (AUGMENTIN ) 875-125 MG tablet Take 1 tablet by mouth every 12 (twelve) hours.   [DISCONTINUED] benzonatate  (TESSALON ) 100 MG capsule Take 1 capsule (100 mg total) by mouth 2 (two) times daily as needed for cough.   [DISCONTINUED] citalopram  (CELEXA ) 10 MG tablet Take 1 tablet (10 mg total) by mouth daily.   No facility-administered medications prior to visit.   "

## 2024-09-15 ENCOUNTER — Encounter: Payer: 59 | Admitting: Dermatology

## 2024-10-21 ENCOUNTER — Encounter: Admitting: Dermatology

## 2025-02-16 ENCOUNTER — Encounter: Admitting: Family Medicine
